# Patient Record
Sex: Male | Born: 1975 | Race: White | Hispanic: No | Marital: Married | Smoking: Current every day smoker
Health system: Southern US, Community
[De-identification: ages and names within clinical notes are randomized; demographics above are authoritative.]

## PROBLEM LIST (undated history)

## (undated) DIAGNOSIS — Z9289 Personal history of other medical treatment: Secondary | ICD-10-CM

## (undated) DIAGNOSIS — R112 Nausea with vomiting, unspecified: Secondary | ICD-10-CM

## (undated) DIAGNOSIS — F1910 Other psychoactive substance abuse, uncomplicated: Secondary | ICD-10-CM

## (undated) DIAGNOSIS — Z9889 Other specified postprocedural states: Secondary | ICD-10-CM

## (undated) DIAGNOSIS — R51 Headache: Secondary | ICD-10-CM

## (undated) DIAGNOSIS — R519 Headache, unspecified: Secondary | ICD-10-CM

## (undated) DIAGNOSIS — W3400XA Accidental discharge from unspecified firearms or gun, initial encounter: Secondary | ICD-10-CM

## (undated) DIAGNOSIS — I10 Essential (primary) hypertension: Secondary | ICD-10-CM

## (undated) HISTORY — PX: SPINE SURGERY: SHX786

## (undated) HISTORY — PX: CYST EXCISION: SHX5701

## (undated) HISTORY — PX: BACK SURGERY: SHX140

---

## 1996-05-29 DIAGNOSIS — W3400XA Accidental discharge from unspecified firearms or gun, initial encounter: Secondary | ICD-10-CM

## 1996-05-29 HISTORY — DX: Accidental discharge from unspecified firearms or gun, initial encounter: W34.00XA

## 2015-01-05 ENCOUNTER — Encounter: Payer: Self-pay | Admitting: Physician Assistant

## 2015-01-05 ENCOUNTER — Ambulatory Visit (INDEPENDENT_AMBULATORY_CARE_PROVIDER_SITE_OTHER): Payer: BLUE CROSS/BLUE SHIELD | Admitting: Physician Assistant

## 2015-01-05 ENCOUNTER — Encounter (INDEPENDENT_AMBULATORY_CARE_PROVIDER_SITE_OTHER): Payer: Self-pay

## 2015-01-05 ENCOUNTER — Ambulatory Visit (INDEPENDENT_AMBULATORY_CARE_PROVIDER_SITE_OTHER): Payer: BLUE CROSS/BLUE SHIELD

## 2015-01-05 VITALS — BP 143/96 | HR 88 | Temp 98.6°F | Ht 72.0 in | Wt 203.0 lb

## 2015-01-05 DIAGNOSIS — M5442 Lumbago with sciatica, left side: Secondary | ICD-10-CM

## 2015-01-05 MED ORDER — HYDROCODONE-ACETAMINOPHEN 5-325 MG PO TABS: 1.0000 | ORAL_TABLET | Freq: Four times a day (QID) | ORAL | Status: DC | PRN

## 2015-01-05 MED ORDER — PREDNISONE 10 MG (21) PO TBPK: ORAL_TABLET | ORAL | Status: DC

## 2015-01-05 MED ORDER — MELOXICAM 15 MG PO TABS: 15.0000 mg | ORAL_TABLET | Freq: Every day | ORAL | Status: DC

## 2015-01-05 NOTE — Progress Notes (Signed)
   Subjective:    Patient ID: Eugene Perez, male    DOB: 1975-11-27, 39 y.o.   MRN: [id number]  HPI 69 y/lo male presents for worsening left lower back pain that radiates down his left leg x 2 weeks. He has a h/o  back surgery that was due to pain on his right side in 2012 - Dr. Clinton Sawyer in [place], [place].   Pain is worse first thing in the morning when he first wakes up. Better with squatting. Has tried medications that he had from his back surgery in 2012 ( pain med and neurontin) which provided mild relief.   No known injury. 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   Lumbar decompression Jan 2013 Discectomy 2013   Review of Systems  Constitutional: Negative.   HENT: Negative.   Eyes: Negative.   Respiratory: Negative.   Cardiovascular: Negative.   Gastrointestinal: Negative.   Endocrine: Negative.   Genitourinary: Negative.   Musculoskeletal: Positive for back pain (left side, radiating down left leg ).  Skin: Negative.   Neurological:       Numbness, tingling, radiating pain down left leg        Objective:   Physical Exam  Constitutional: He is oriented to person, place, and time. He appears well-developed and well-nourished. No distress.  Cardiovascular: Normal rate.   Musculoskeletal: He exhibits tenderness. He exhibits no edema.  Decreased ROM d/t pain   Neurological: He is alert and oriented to person, place, and time.  Skin: He is not diaphoretic.  Psychiatric: He has a normal mood and affect. His behavior is normal. Judgment and thought content normal.  Nursing note and vitals reviewed.         Assessment & Plan:  1. Low back pain with left-sided sciatica, unspecified back pain laterality  - DG Lumbar Spine 2-3 Views; Future - predniSONE (STERAPRED UNI-PAK 21 TAB) 10 MG (21) TBPK tablet; 6 pills PO on day 1, 5 on day 2, 4 on day 3, 3 on day 4, 2 on day 5, 1 on day 6  Dispense: 21 tablet; Refill: 0 - meloxicam (MOBIC) 15 MG tablet; Take 1 tablet (15  mg total) by mouth daily.  Dispense: 30 tablet; Refill: 0 - HYDROcodone-acetaminophen (NORCO) 5-325 MG per tablet; Take 1-2 tablets by mouth every 6 (six) hours as needed for moderate pain.  Dispense: 120 tablet; Refill:   - Referral to ortho for addition evaluation   I have explained to patient that it may take longer for referral since he prefers not to go back to the Surgeon that performed his first surgery. He verbalizes understanding.     Tiffany A. Chauncey Reading PA-C

## 2015-01-12 ENCOUNTER — Telehealth: Payer: Self-pay | Admitting: Physician Assistant

## 2015-01-12 DIAGNOSIS — M5442 Lumbago with sciatica, left side: Secondary | ICD-10-CM

## 2015-01-12 NOTE — Telephone Encounter (Signed)
I ordered referral off of your note. Will you please review question about the steroid

## 2015-01-14 ENCOUNTER — Other Ambulatory Visit: Payer: Self-pay | Admitting: Physician Assistant

## 2015-01-14 DIAGNOSIS — M5442 Lumbago with sciatica, left side: Secondary | ICD-10-CM

## 2015-01-14 MED ORDER — PREDNISONE 10 MG (21) PO TBPK: ORAL_TABLET | ORAL | Status: DC

## 2015-01-14 NOTE — Telephone Encounter (Signed)
Yes, he is getting a referral , however, I explained to patient that it may take longer for referral since he prefers not to go back to the Surgeon that performed his first surgery in [address]. This has to be approved by his insurance. I advised him during his last appointment to call his old ortho dr for quicker appointment. We can not the type of steroid injection that he needs in office. It must be done under ultrasound in an ortho office. I can give 1 refill of the steroids but pain will most likely recur after discontinuation.   Eugene Perez A. Chauncey Reading PA-C

## 2015-01-14 NOTE — Telephone Encounter (Signed)
Pt notified of Tiffany's recommendation Verbalizes understanding 

## 2015-01-14 NOTE — Telephone Encounter (Signed)
He has to have an appointment with ortho to have a steroid injection in the spine. No family practice will do that. It may work better but he will not know until he is evaluated further by an ortho office. For the quickest appt, I suggest he follow up with his old office instead of waiting on insurance approval and appt for a new patient at a new practice.   Stana Bayon A. Chauncey Reading PA-C

## 2015-01-14 NOTE — Telephone Encounter (Signed)
Pt notified that RX was sent into [facility] He wants to know if steroid injection would work better Please advise

## 2015-01-15 NOTE — Telephone Encounter (Signed)
Pt notified that a family practitioner would not be able to spinal steroid injection that would need to be done after further eval from Ortho. Pt is wanting to wait for appt w/ Ortho in [place], would rather not return to previous Ortho.

## 2015-01-21 ENCOUNTER — Encounter: Payer: Self-pay | Admitting: Orthopedic Surgery

## 2015-03-06 ENCOUNTER — Telehealth: Payer: Self-pay | Admitting: Family Medicine

## 2015-03-06 NOTE — Telephone Encounter (Signed)
After hours call requesting refill of prednisone for back pain.  I declined. Encouraged 2 aleve BID X 3-4 days, tylenol, heat, and stretching.  No trouble walking or bowel/bladder dysfunction.   Red flags for being seen reviewed and they understand.   Murtis Sink, MD [facility] 03/06/2015, 3:43 PM

## 2015-03-16 ENCOUNTER — Telehealth: Payer: Self-pay | Admitting: Family Medicine

## 2015-03-16 ENCOUNTER — Telehealth: Payer: Self-pay

## 2015-03-16 NOTE — Telephone Encounter (Signed)
I have spoken with him on the phone previously about this.  I could not get him on the phone toinight.   We cannot refill prednisone or norco without an appointment.   We can give some mobic, 30 pills but he really needs to be seen.   I will ask nursing to follow up on his referral tomorrow.   Murtis SinkSam Maksymilian Mabey, MD [facility] 03/16/2015, 6:28 PM

## 2015-03-16 NOTE — Telephone Encounter (Signed)
Pt was upset bc we could not refill his prednisone or norco but would refill the mobic. Pt states that it was ridiculous and he would go some where else. .Marland Kitchen

## 2015-03-17 NOTE — Telephone Encounter (Signed)
This is sent to pool A, already

## 2015-03-29 ENCOUNTER — Ambulatory Visit (INDEPENDENT_AMBULATORY_CARE_PROVIDER_SITE_OTHER): Payer: BLUE CROSS/BLUE SHIELD | Admitting: Pediatrics

## 2015-03-29 ENCOUNTER — Encounter: Payer: Self-pay | Admitting: Pediatrics

## 2015-03-29 VITALS — BP 150/94 | HR 85 | Temp 98.5°F | Ht 72.0 in | Wt 212.0 lb

## 2015-03-29 DIAGNOSIS — M5442 Lumbago with sciatica, left side: Secondary | ICD-10-CM | POA: Diagnosis not present

## 2015-03-29 DIAGNOSIS — M545 Low back pain, unspecified: Secondary | ICD-10-CM | POA: Insufficient documentation

## 2015-03-29 DIAGNOSIS — I10 Essential (primary) hypertension: Secondary | ICD-10-CM | POA: Diagnosis not present

## 2015-03-29 MED ORDER — MELOXICAM 15 MG PO TABS: 15.0000 mg | ORAL_TABLET | Freq: Every day | ORAL | Status: DC

## 2015-03-29 MED ORDER — HYDROCODONE-ACETAMINOPHEN 5-325 MG PO TABS: 1.0000 | ORAL_TABLET | Freq: Four times a day (QID) | ORAL | Status: DC | PRN

## 2015-03-29 NOTE — Progress Notes (Signed)
Subjective:    Patient ID: Eugene Perez, male    DOB: 12/07/1975, 39 y.o.   MRN: [id number]  CC: low back pain  HPI: Eugene MountsKeith Jason Perez is a 39 y.o. male presenting on 03/29/2015 for Back Pain  Has had two back surgeries. Had a broken vertebrae. Two discs ruptured. Has had multiple MRIs. No pain at first after back surgeries, pain started apprx 2.5 mo ago. Prednisone took away the pain. Has had two prednisone taper packs over past 10 weeks. Took norco and meloxicam after the prednisone pack was over, helped some with the pain but not as much as the prednisone. If stands on L leg for 10 minutes not moving L leg will go numb, and he can't control it. He has not fallen. Has pain off and on, when he starts moving it is worse.  For about the past week has had leg pain again, pain shoots down back of L leg.   Relevant past medical, surgical, family and social history reviewed and updated as indicated. Interim medical history since our last visit reviewed. Allergies and medications reviewed and updated.   ROS: Per HPI unless specifically indicated above  Past Medical History Patient Active Problem List   Diagnosis Date Noted  . Low back pain 03/29/2015  . Essential hypertension 03/29/2015    Current Outpatient Prescriptions  Medication Sig Dispense Refill  . HYDROcodone-acetaminophen (NORCO) 5-325 MG tablet Take 1-2 tablets by mouth every 6 (six) hours as needed for moderate pain. 60 tablet 0  . meloxicam (MOBIC) 15 MG tablet Take 1 tablet (15 mg total) by mouth daily. 30 tablet 5   No current facility-administered medications for this visit.       Objective:    BP 150/94 mmHg  Pulse 85  Temp(Src) 98.5 F (36.9 C) (Oral)  Ht 6' (1.829 m)  Wt 212 lb (96.163 kg)  BMI 28.75 kg/m2  Wt Readings from Last 3 Encounters:  03/29/15 212 lb (96.163 kg)  01/05/15 203 lb (92.08 kg)    Gen: NAD, alert, cooperative with exam, NCAT EYES: EOMI, no scleral injection or  icterus CV: NRRR, normal S1/S2, no murmur, distal pulses 2+ b/l Resp: CTABL, no wheezes, normal WOB Abd: +BS, soft, NTND. no guarding or organomegaly Ext: No edema, warm Neuro: Alert and oriented, strength equal b/l UE and LE including hand grip, hip flex, knee flex/extpatellar reflexes 2+ b/l, decreased sensation over L leg compared to R, coordination grossly normal MSK: no point tenderness over spine or paraspinous muscles     Assessment & Plan:   Mellody DanceKeith was seen today for back pain, numbness L leg. Will refer back to orthopedic surgery, while pt does not want surgery if at all possible, would be interested in steroid injections if it is something that might help delay further surgery. For now will do Meloxicam daily and norco as needed. Discussed only taking norco as needed, not sharing pills, keeping out ofreach of children and others. Gave one prescription for #60 tabs of norco 5mg /325mg . Would need to be seen in clinic before any more refills are given, pt aware. If needing regularly will refer to pain clinic. Pt's blood pressure is still elevated today. Pt does not want to have blood drawn today, wanted to delay starting Bp meds and labs until next visit. Will follow up in 4 weeks.  Diagnoses and all orders for this visit:  Low back pain with left-sided sciatica, unspecified back pain laterality -     meloxicam (  MOBIC) 15 MG tablet; Take 1 tablet (15 mg total) by mouth daily. -     HYDROcodone-acetaminophen (NORCO) 5-325 MG tablet; Take 1-2 tablets by mouth every 6 (six) hours as needed for severe -     Ambulatory referral to Orthopedic Surgery  Essential hypertension Needs labs Has been on BP meds in the past  Follow up plan: Return in about 4 weeks (around 04/26/2015) for follow up back pain and blood pressure.  Rex Kras, MD [facility] 03/29/2015, 4:28 PM

## 2015-04-26 ENCOUNTER — Ambulatory Visit: Payer: BLUE CROSS/BLUE SHIELD | Admitting: Pediatrics

## 2015-04-27 ENCOUNTER — Encounter: Payer: Self-pay | Admitting: Pediatrics

## 2015-06-16 ENCOUNTER — Encounter: Payer: Self-pay | Admitting: Pediatrics

## 2015-06-16 ENCOUNTER — Ambulatory Visit (INDEPENDENT_AMBULATORY_CARE_PROVIDER_SITE_OTHER): Payer: BLUE CROSS/BLUE SHIELD | Admitting: Pediatrics

## 2015-06-16 VITALS — BP 161/96 | HR 74 | Temp 98.9°F | Ht 72.0 in | Wt 214.0 lb

## 2015-06-16 DIAGNOSIS — G629 Polyneuropathy, unspecified: Secondary | ICD-10-CM | POA: Diagnosis not present

## 2015-06-16 DIAGNOSIS — M5442 Lumbago with sciatica, left side: Secondary | ICD-10-CM

## 2015-06-16 DIAGNOSIS — I10 Essential (primary) hypertension: Secondary | ICD-10-CM

## 2015-06-16 MED ORDER — HYDROCODONE-ACETAMINOPHEN 10-325 MG PO TABS: 0.5000 | ORAL_TABLET | Freq: Two times a day (BID) | ORAL | Status: DC | PRN

## 2015-06-16 MED ORDER — GABAPENTIN 300 MG PO CAPS: 300.0000 mg | ORAL_CAPSULE | Freq: Three times a day (TID) | ORAL | Status: AC

## 2015-06-16 MED ORDER — AMLODIPINE BESYLATE 5 MG PO TABS: 5.0000 mg | ORAL_TABLET | Freq: Every day | ORAL | Status: AC

## 2015-06-16 NOTE — Progress Notes (Signed)
Subjective:    Patient ID: Eugene Perez, male    DOB: [birth year], 40 y.o.   MRN: [id number]  CC: Back Pain   HPI: Eugene Perez is a 40 y.o. male presenting for Back Pain  Says L leg is ten times worse No back pain, all pain in L leg, starting in L buttock, constantly shooting down leg, position changes changes where the pain is but doesn't really take it away. Feels sharp sometimes, tingling, "like an army of ants" going down his leg  H/o broken back, had two surgeries  Sleeping has been fine, not keeping him awake at night  Neurosurgeon: Eugene Perez in [place] before, wants to be referred to someone in [place]. Original surgeries for similar symptoms in R leg. Did well for three years following the last surgery, past few years has had worsening pain/tingling symptoms in L leg.  At times has weakness in L leg, has tripped and fallen before because can't feel where leg is in space.  No fevers, no incontinence   Depression screen [facility] 2/9 06/16/2015 03/29/2015  Decreased Interest 0 0  Down, Depressed, Hopeless 0 0  PHQ - 2 Score 0 0     Relevant past medical, surgical, family and social history reviewed and updated as indicated. Interim medical history since our last visit reviewed. Allergies and medications reviewed and updated.    ROS: Per HPI unless specifically indicated above  History  Smoking status  . Current Every Day Smoker -- 1.00 packs/day  Smokeless tobacco  . Never Used    Past Medical History Patient Active Problem List   Diagnosis Date Noted  . Low back pain 03/29/2015  . Essential hypertension 03/29/2015    Current Outpatient Prescriptions  Medication Sig Dispense Refill  . meloxicam (MOBIC) 15 MG tablet Take 1 tablet (15 mg total) by mouth daily. 30 tablet 5  . amLODipine (NORVASC) 5 MG tablet Take 1 tablet (5 mg total) by mouth daily. 90 tablet 3  . gabapentin (NEURONTIN) 300 MG capsule Take 1 capsule (300 mg total) by  mouth 3 (three) times daily. 90 capsule 3  . HYDROcodone-acetaminophen (NORCO) 10-325 MG tablet Take 0.5-1 tablets by mouth every 12 (twelve) hours as needed. Do not take if you don't need it. 60 tablet 0   No current facility-administered medications for this visit.       Objective:    BP 161/96 mmHg  Pulse 74  Temp(Src) 98.9 F (37.2 C) (Oral)  Ht 6' (1.829 m)  Wt 214 lb (97.07 kg)  BMI 29.02 kg/m2  Wt Readings from Last 3 Encounters:  06/16/15 214 lb (97.07 kg)  03/29/15 212 lb (96.163 kg)  01/05/15 203 lb (92.08 kg)     Gen: NAD, alert, cooperative with exam, NCAT EYES: EOMI, no scleral injection or icterus ENT:  MMM LYMPH: no cervical LAD CV: NRRR, normal S1/S2 Resp: CTABL, no wheezes, normal WOB Ext: No edema, warm Neuro: Alert and oriented, strength equal b/l UE and LE, coordination grossly normal, tingling down to bottom of foot with touch to medial and lateral posterior L calf, normal sensation R leg     Assessment & Plan:    Eugene Perez was seen today for below med problem follow up.  Diagnoses and all orders for this visit:  Low back pain with left-sided sciatica, unspecified back pain laterality Pt in constant discomfort from the sciatica and neuropathy. Hydrocodone helped some when tried last visit. Will give Rx for #60 tabs of   norco, must be seen for further refills. Discussed ideally this is not a long term medication. If continues to need, will need UDS and pt to sign controlled substance contract. -     Ambulatory referral to Neurosurgery  -     HYDROcodone-acetaminophen (NORCO) 10-325 MG tablet; Take 0.5-1 tablets by mouth every 12 (twelve) hours as needed. Do not take if you don't need it.  Essential hypertension Has had persistently elevated BPs. Pt refuses labs today. Will start amlodipine. Pt agrees to labs next visit. -     amLODipine (NORVASC) 5 MG tablet; Take 1 tablet (5 mg total) by mouth daily.  Neuropathy (HCC) Worsening symptoms, h/o  multiple surgeries on back.  -     Ambulatory referral to Neurosurgery -     gabapentin (NEURONTIN) 300 MG capsule; Take 1 capsule (300 mg total) by mouth 3 (three) times daily.     Follow up plan: Return in about 2 weeks (around 06/30/2015) for follo wup wtih Dr. Oswaldo Perez, Bp and labs.  Rex Kras, MD [facility] 06/16/2015, 1:03 PM

## 2015-07-27 ENCOUNTER — Other Ambulatory Visit: Payer: Self-pay | Admitting: Neurosurgery

## 2015-07-27 DIAGNOSIS — M4316 Spondylolisthesis, lumbar region: Secondary | ICD-10-CM

## 2015-08-03 ENCOUNTER — Ambulatory Visit
Admission: RE | Admit: 2015-08-03 | Discharge: 2015-08-03 | Disposition: A | Discharge status: Home / Self Care | Payer: BLUE CROSS/BLUE SHIELD | Source: Ambulatory Visit | Attending: Neurosurgery | Admitting: Neurosurgery

## 2015-08-03 ENCOUNTER — Other Ambulatory Visit: Payer: Self-pay | Admitting: Neurosurgery

## 2015-08-03 ENCOUNTER — Inpatient Hospital Stay
Admission: RE | Admit: 2015-08-03 | Discharge: 2015-08-03 | Disposition: A | Discharge status: Home / Self Care | Payer: Self-pay | Source: Ambulatory Visit | Attending: Neurosurgery | Admitting: Neurosurgery

## 2015-08-03 DIAGNOSIS — R52 Pain, unspecified: Secondary | ICD-10-CM

## 2015-08-03 DIAGNOSIS — M4316 Spondylolisthesis, lumbar region: Secondary | ICD-10-CM

## 2015-08-03 MED ORDER — MEPERIDINE HCL 100 MG/ML IJ SOLN
75.0000 mg | Freq: Once | INTRAMUSCULAR | Status: AC
  Administered 2015-08-03: 100 mg via INTRAMUSCULAR

## 2015-08-03 MED ORDER — IOHEXOL 180 MG/ML  SOLN
15.0000 mL | Freq: Once | INTRAMUSCULAR | Status: AC | PRN
  Administered 2015-08-03: 15 mL via INTRATHECAL

## 2015-08-03 MED ORDER — DIAZEPAM 5 MG PO TABS
10.0000 mg | ORAL_TABLET | Freq: Once | ORAL | Status: AC
  Administered 2015-08-03: 10 mg via ORAL

## 2015-08-03 MED ORDER — ONDANSETRON HCL 4 MG/2ML IJ SOLN
4.0000 mg | Freq: Once | INTRAMUSCULAR | Status: AC
  Administered 2015-08-03: 4 mg via INTRAMUSCULAR

## 2015-08-03 NOTE — Discharge Instructions (Signed)
Myelogram Discharge Instructions  1. Go home and rest quietly for the next 24 hours.  It is important to lie flat for the next 24 hours.  Get up only to go to the restroom.  You may lie in the bed or on a couch on your back, your stomach, your left side or your right side.  You may have one pillow under your head.  You may have pillows between your knees while you are on your side or under your knees while you are on your back.  2. DO NOT drive today.  Recline the seat as far back as it will go, while still wearing your seat belt, on the way home.  3. You may get up to go to the bathroom as needed.  You may sit up for 10 minutes to eat.  You may resume your normal diet and medications unless otherwise indicated.  Drink lots of extra fluids today and tomorrow.  4. The incidence of headache, nausea, or vomiting is about 5% (one in 20 patients).  If you develop a headache, lie flat and drink plenty of fluids until the headache goes away.  Caffeinated beverages may be helpful.  If you develop severe nausea and vomiting or a headache that does not go away with flat bed rest, call [phone].  5. You may resume normal activities after your 24 hours of bed rest is over; however, do not exert yourself strongly or do any heavy lifting tomorrow. If when you get up you have a headache when standing, go back to bed and force fluids for another 24 hours.  6. Call your physician for a follow-up appointment.  The results of your myelogram will be sent directly to your physician by the following day.  7. If you have any questions or if complications develop after you arrive home, please call [phone].  Discharge instructions have been explained to the patient.  The patient, or the person responsible for the patient, fully understands these instructions.       May resume Tramadol on August 04, 2015, after 8:00 am.

## 2015-08-10 ENCOUNTER — Other Ambulatory Visit: Payer: Self-pay | Admitting: Neurosurgery

## 2015-08-19 ENCOUNTER — Inpatient Hospital Stay (HOSPITAL_COMMUNITY): Admission: RE | Admit: 2015-08-19 | Payer: BLUE CROSS/BLUE SHIELD | Source: Ambulatory Visit

## 2015-08-23 ENCOUNTER — Encounter (HOSPITAL_COMMUNITY)
Admission: RE | Admit: 2015-08-23 | Discharge: 2015-08-23 | Disposition: A | Discharge status: Home / Self Care | Payer: BLUE CROSS/BLUE SHIELD | Source: Ambulatory Visit | Attending: Neurosurgery | Admitting: Neurosurgery

## 2015-08-23 ENCOUNTER — Encounter (HOSPITAL_COMMUNITY): Payer: Self-pay

## 2015-08-23 HISTORY — DX: Other psychoactive substance abuse, uncomplicated: F19.10

## 2015-08-23 HISTORY — DX: Nausea with vomiting, unspecified: R11.2

## 2015-08-23 HISTORY — DX: Headache, unspecified: R51.9

## 2015-08-23 HISTORY — DX: Personal history of other medical treatment: Z92.89

## 2015-08-23 HISTORY — DX: Accidental discharge from unspecified firearms or gun, initial encounter: W34.00XA

## 2015-08-23 HISTORY — DX: Essential (primary) hypertension: I10

## 2015-08-23 HISTORY — DX: Headache: R51

## 2015-08-23 HISTORY — DX: Other specified postprocedural states: Z98.890

## 2015-08-23 LAB — COMPREHENSIVE METABOLIC PANEL
ALBUMIN: 3.9 g/dL (ref 3.5–5.0)
ALK PHOS: 52 U/L (ref 38–126)
ALT: 22 U/L (ref 17–63)
AST: 26 U/L (ref 15–41)
Anion gap: 9 (ref 5–15)
BILIRUBIN TOTAL: 0.5 mg/dL (ref 0.3–1.2)
BUN: 11 mg/dL (ref 6–20)
CHLORIDE: 107 mmol/L (ref 101–111)
CO2: 25 mmol/L (ref 22–32)
Calcium: 9.1 mg/dL (ref 8.9–10.3)
Creatinine, Ser: 1.12 mg/dL (ref 0.61–1.24)
GFR calc Af Amer: >60 mL/min (ref >60)
GLUCOSE: 125 mg/dL — AB (ref 65–99)
POTASSIUM: 4.1 mmol/L (ref 3.5–5.1)
Sodium: 141 mmol/L (ref 135–145)
Total Protein: 6.5 g/dL (ref 6.5–8.1)

## 2015-08-23 LAB — TYPE AND SCREEN
ABO/RH(D): A POS
ANTIBODY SCREEN: NEGATIVE

## 2015-08-23 LAB — SURGICAL PCR SCREEN
MRSA, PCR: NEGATIVE
Staphylococcus aureus: POSITIVE — AB

## 2015-08-23 LAB — CBC
HCT: 44.6 % (ref 39.0–52.0)
Hemoglobin: 15.1 g/dL (ref 13.0–17.0)
MCH: 29.8 pg (ref 26.0–34.0)
MCHC: 33.9 g/dL (ref 30.0–36.0)
MCV: 88.1 fL (ref 78.0–100.0)
PLATELETS: 223 10*3/uL (ref 150–400)
RBC: 5.06 MIL/uL (ref 4.22–5.81)
RDW: 12.7 % (ref 11.5–15.5)
WBC: 9 10*3/uL (ref 4.0–10.5)

## 2015-08-23 LAB — ABO/RH: ABO/RH(D): A POS

## 2015-08-23 MED ORDER — CEFAZOLIN SODIUM-DEXTROSE 2-4 GM/100ML-% IV SOLN
2.0000 g | INTRAVENOUS | Status: AC
  Administered 2015-08-24: 2 g via INTRAVENOUS
  Filled 2015-08-23: qty 100

## 2015-08-23 NOTE — Pre-Procedure Instructions (Signed)
Eugene Perez  08/23/2015      [facility] [address] [place], [place] - [address] [place] [id number] Phone: [phone] Fax: [phone]    Your procedure is scheduled on Tuesday March 28th 2017.  [facility] at 645 A.M.  Call this number if you have problems the morning of surgery:  [phone]   Remember:  Do not eat food or drink liquids after midnight.  Take these medicines the morning of surgery with A SIP OF WATER amlodipine (norvasc), gabapentin (neurontin), oxycodone (roxicodone) if needed  STOP: ALL Vitamins, Supplements, Effient and Herbal Medications, Fish Oils, Aspirins, NSAIDs (Nonsteroidal Anti-inflammatories such as Ibuprofen, Aleve, or Advil), and Goody's/BC Powders 7 days prior to surgery, until after surgery as directed by your physician.    Do not wear jewelry, make-up or nail polish.  Do not wear lotions, powders, or perfumes.  You may wear deodorant.  Do not shave 48 hours prior to surgery.  Men may shave face and neck.  Do not bring valuables to the hospital.  [facility] is not responsible for any belongings or valuables.  Contacts, dentures or bridgework may not be worn into surgery.  Leave your suitcase in the car.  After surgery it may be brought to your room.  For patients admitted to the hospital, discharge time will be determined by your treatment team.  Patients discharged the day of surgery will not be allowed to drive home.        Preparing for [facility] at [facility]  Before surgery, you can play an important role.  Because skin is not sterile, your skin needs to be as free of germs as possible.  You can reduce the number of germs on your skin by washing with CHG (chlorahexidine gluconate) Soap before surgery.  CHG is an antiseptic cleaner with kills germs and bonds with the skin to continue killing germs even after washing.   Please do not use if you have an allergy to CHG or  antibacterial soaps.  If your skin becomes reddened/irritated stop using the CHG.  Do not shave (including legs and underarms) for at least 48 hours prior to first CHG shower.  It is okay to shave your face.  Please follow these instructions carefully:  1. Shower with CHG Soap the night before surgery and the morning of Surgery. 2. If you choose to wash your hair, wash your hair first as usual with your normal shampoo. 3. After you shampoo, rinse your hair and body thoroughly to remove the Shampoo. 4. Use CHG as you would any other liquid soap. You can apply chg directly to the skin and wash gently with scrungie or a clean washcloth. 5. Apply the CHG Soap to your body ONLY FROM THE NECK DOWN. Do not use on open wounds or open sores. Avoid contact with your eyes, ears, mouth and genitals (private parts). Wash genitals (private parts) with your normal soap. 6. Wash thoroughly, paying special attention to the area where your surgery will be performed. 7. Thoroughly rinse your body with warm water from the neck down. 8. DO NOT shower/wash with your normal soap after using and rinsing off the CHG Soap. 9. Pat yourself dry with a clean towel.  10. Wear clean pajamas.  11. Place clean sheets on your bed the night of your first shower and do not sleep with pets.  Day of Surgery  Do not apply any lotions/deodorants the  morning of surgery. Please wear clean clothes to the hospital/surgery center.   Please read over the following fact sheets that you were given. Pain Booklet, Coughing and Deep Breathing, Blood Transfusion Information, MRSA Information and Surgical Site Infection Prevention

## 2015-08-24 ENCOUNTER — Inpatient Hospital Stay (HOSPITAL_COMMUNITY): Payer: BLUE CROSS/BLUE SHIELD | Admitting: Anesthesiology

## 2015-08-24 ENCOUNTER — Inpatient Hospital Stay (HOSPITAL_COMMUNITY): Payer: BLUE CROSS/BLUE SHIELD

## 2015-08-24 ENCOUNTER — Encounter (HOSPITAL_COMMUNITY)
Admission: AD | Disposition: A | Discharge status: Home / Self Care | Payer: Self-pay | Source: Ambulatory Visit | Attending: Neurosurgery

## 2015-08-24 ENCOUNTER — Inpatient Hospital Stay (HOSPITAL_COMMUNITY): Payer: BLUE CROSS/BLUE SHIELD | Admitting: Emergency Medicine

## 2015-08-24 ENCOUNTER — Inpatient Hospital Stay (HOSPITAL_COMMUNITY)
Admission: AD | Admit: 2015-08-24 | Discharge: 2015-08-26 | DRG: 460 | Disposition: A | Discharge status: Home / Self Care | Payer: BLUE CROSS/BLUE SHIELD | Source: Ambulatory Visit | Attending: Neurosurgery | Admitting: Neurosurgery

## 2015-08-24 ENCOUNTER — Encounter (HOSPITAL_COMMUNITY): Payer: Self-pay | Admitting: *Deleted

## 2015-08-24 DIAGNOSIS — M4316 Spondylolisthesis, lumbar region: Principal | ICD-10-CM | POA: Diagnosis present

## 2015-08-24 DIAGNOSIS — Z79899 Other long term (current) drug therapy: Secondary | ICD-10-CM | POA: Diagnosis not present

## 2015-08-24 DIAGNOSIS — I10 Essential (primary) hypertension: Secondary | ICD-10-CM | POA: Diagnosis present

## 2015-08-24 DIAGNOSIS — F17210 Nicotine dependence, cigarettes, uncomplicated: Secondary | ICD-10-CM | POA: Diagnosis present

## 2015-08-24 DIAGNOSIS — M79605 Pain in left leg: Secondary | ICD-10-CM | POA: Diagnosis present

## 2015-08-24 DIAGNOSIS — M5410 Radiculopathy, site unspecified: Secondary | ICD-10-CM | POA: Diagnosis present

## 2015-08-24 DIAGNOSIS — M549 Dorsalgia, unspecified: Secondary | ICD-10-CM

## 2015-08-24 HISTORY — PX: POSTERIOR LUMBAR FUSION: SHX6036

## 2015-08-24 SURGERY — POSTERIOR LUMBAR FUSION 1 LEVEL
Anesthesia: General | Site: Back

## 2015-08-24 MED ORDER — SODIUM CHLORIDE 0.9 % IV SOLN: 250.0000 mL | INTRAVENOUS | Status: DC

## 2015-08-24 MED ORDER — SUGAMMADEX SODIUM 200 MG/2ML IV SOLN
INTRAVENOUS | Status: DC | PRN
  Administered 2015-08-24: 200 mg via INTRAVENOUS

## 2015-08-24 MED ORDER — MENTHOL 3 MG MT LOZG: 1.0000 | LOZENGE | OROMUCOSAL | Status: DC | PRN

## 2015-08-24 MED ORDER — ONDANSETRON HCL 4 MG/2ML IJ SOLN
INTRAMUSCULAR | Status: AC
  Filled 2015-08-24: qty 2

## 2015-08-24 MED ORDER — ONDANSETRON HCL 4 MG/2ML IJ SOLN: 4.0000 mg | Freq: Four times a day (QID) | INTRAMUSCULAR | Status: DC | PRN

## 2015-08-24 MED ORDER — SENNA 8.6 MG PO TABS
1.0000 | ORAL_TABLET | Freq: Two times a day (BID) | ORAL | Status: DC
  Administered 2015-08-24 – 2015-08-26 (×4): 8.6 mg via ORAL
  Filled 2015-08-24 (×4): qty 1

## 2015-08-24 MED ORDER — ROCURONIUM BROMIDE 50 MG/5ML IV SOLN
INTRAVENOUS | Status: AC
  Filled 2015-08-24: qty 1

## 2015-08-24 MED ORDER — AMLODIPINE BESYLATE 5 MG PO TABS
5.0000 mg | ORAL_TABLET | Freq: Every day | ORAL | Status: DC
  Administered 2015-08-25: 5 mg via ORAL
  Filled 2015-08-24: qty 1

## 2015-08-24 MED ORDER — ONDANSETRON HCL 4 MG/2ML IJ SOLN
4.0000 mg | Freq: Four times a day (QID) | INTRAMUSCULAR | Status: DC | PRN
  Administered 2015-08-24 – 2015-08-26 (×5): 4 mg via INTRAVENOUS
  Filled 2015-08-24 (×5): qty 2

## 2015-08-24 MED ORDER — VANCOMYCIN HCL 1000 MG IV SOLR
INTRAVENOUS | Status: AC
  Filled 2015-08-24: qty 1000

## 2015-08-24 MED ORDER — SODIUM CHLORIDE 0.9 % IV SOLN
INTRAVENOUS | Status: DC
  Administered 2015-08-24: 16:00:00 via INTRAVENOUS

## 2015-08-24 MED ORDER — NALOXONE HCL 0.4 MG/ML IJ SOLN: 0.4000 mg | INTRAMUSCULAR | Status: DC | PRN

## 2015-08-24 MED ORDER — VANCOMYCIN HCL 1000 MG IV SOLR
INTRAVENOUS | Status: DC | PRN
  Administered 2015-08-24: 1000 mg

## 2015-08-24 MED ORDER — DIPHENHYDRAMINE HCL 12.5 MG/5ML PO ELIX: 12.5000 mg | ORAL_SOLUTION | Freq: Four times a day (QID) | ORAL | Status: DC | PRN

## 2015-08-24 MED ORDER — CEFAZOLIN SODIUM 1-5 GM-% IV SOLN
1.0000 g | Freq: Three times a day (TID) | INTRAVENOUS | Status: AC
  Administered 2015-08-24 – 2015-08-25 (×2): 1 g via INTRAVENOUS
  Filled 2015-08-24 (×2): qty 50

## 2015-08-24 MED ORDER — PROPOFOL 10 MG/ML IV BOLUS
INTRAVENOUS | Status: AC
  Filled 2015-08-24: qty 20

## 2015-08-24 MED ORDER — ACETAMINOPHEN 325 MG PO TABS: 650.0000 mg | ORAL_TABLET | ORAL | Status: DC | PRN

## 2015-08-24 MED ORDER — ONDANSETRON HCL 4 MG/2ML IJ SOLN: 4.0000 mg | INTRAMUSCULAR | Status: DC | PRN

## 2015-08-24 MED ORDER — 0.9 % SODIUM CHLORIDE (POUR BTL) OPTIME
TOPICAL | Status: DC | PRN
  Administered 2015-08-24: 1000 mL

## 2015-08-24 MED ORDER — DIAZEPAM 5 MG PO TABS
ORAL_TABLET | ORAL | Status: AC
Start: 2015-08-24 — End: 2015-08-25
  Filled 2015-08-24: qty 1

## 2015-08-24 MED ORDER — HYDROMORPHONE HCL 1 MG/ML IJ SOLN
INTRAMUSCULAR | Status: DC | PRN
  Administered 2015-08-24: .2 mg via INTRAVENOUS
  Administered 2015-08-24 (×2): .4 mg via INTRAVENOUS

## 2015-08-24 MED ORDER — DEXAMETHASONE SODIUM PHOSPHATE 10 MG/ML IJ SOLN
INTRAMUSCULAR | Status: DC | PRN
  Administered 2015-08-24: 10 mg via INTRAVENOUS

## 2015-08-24 MED ORDER — BUPIVACAINE LIPOSOME 1.3 % IJ SUSP
20.0000 mL | Freq: Once | INTRAMUSCULAR | Status: DC
  Filled 2015-08-24: qty 20

## 2015-08-24 MED ORDER — SUGAMMADEX SODIUM 200 MG/2ML IV SOLN
INTRAVENOUS | Status: AC
  Filled 2015-08-24: qty 2

## 2015-08-24 MED ORDER — SODIUM CHLORIDE 0.9% FLUSH: 9.0000 mL | INTRAVENOUS | Status: DC | PRN

## 2015-08-24 MED ORDER — HYDROMORPHONE HCL 1 MG/ML IJ SOLN
INTRAMUSCULAR | Status: AC
  Filled 2015-08-24: qty 1

## 2015-08-24 MED ORDER — PHENYLEPHRINE 40 MCG/ML (10ML) SYRINGE FOR IV PUSH (FOR BLOOD PRESSURE SUPPORT)
PREFILLED_SYRINGE | INTRAVENOUS | Status: AC
  Filled 2015-08-24: qty 10

## 2015-08-24 MED ORDER — OXYCODONE-ACETAMINOPHEN 5-325 MG PO TABS
ORAL_TABLET | ORAL | Status: AC
  Filled 2015-08-24: qty 2

## 2015-08-24 MED ORDER — MIDAZOLAM HCL 5 MG/5ML IJ SOLN
INTRAMUSCULAR | Status: DC | PRN
  Administered 2015-08-24: 2 mg via INTRAVENOUS

## 2015-08-24 MED ORDER — LIDOCAINE HCL (CARDIAC) 20 MG/ML IV SOLN
INTRAVENOUS | Status: DC | PRN
  Administered 2015-08-24: 80 mg via INTRAVENOUS

## 2015-08-24 MED ORDER — OXYCODONE-ACETAMINOPHEN 5-325 MG PO TABS
1.0000 | ORAL_TABLET | ORAL | Status: DC | PRN
  Administered 2015-08-24 – 2015-08-26 (×4): 2 via ORAL
  Filled 2015-08-24 (×4): qty 2

## 2015-08-24 MED ORDER — LIDOCAINE HCL (CARDIAC) 20 MG/ML IV SOLN
INTRAVENOUS | Status: AC
  Filled 2015-08-24: qty 5

## 2015-08-24 MED ORDER — MORPHINE SULFATE 2 MG/ML IV SOLN
INTRAVENOUS | Status: DC
  Administered 2015-08-24: 22.5 mg via INTRAVENOUS
  Administered 2015-08-24: 14:00:00 via INTRAVENOUS
  Administered 2015-08-25: 13.5 mg via INTRAVENOUS
  Administered 2015-08-25: 7.5 mg via INTRAVENOUS

## 2015-08-24 MED ORDER — THROMBIN 20000 UNITS EX SOLR
CUTANEOUS | Status: DC | PRN
  Administered 2015-08-24: 20 mL via TOPICAL

## 2015-08-24 MED ORDER — DIPHENHYDRAMINE HCL 50 MG/ML IJ SOLN: 12.5000 mg | Freq: Four times a day (QID) | INTRAMUSCULAR | Status: DC | PRN

## 2015-08-24 MED ORDER — BUPIVACAINE LIPOSOME 1.3 % IJ SUSP
INTRAMUSCULAR | Status: DC | PRN
  Administered 2015-08-24: 20 mL

## 2015-08-24 MED ORDER — SODIUM CHLORIDE 0.9% FLUSH: 3.0000 mL | INTRAVENOUS | Status: DC | PRN

## 2015-08-24 MED ORDER — PHENOL 1.4 % MT LIQD: 1.0000 | OROMUCOSAL | Status: DC | PRN

## 2015-08-24 MED ORDER — ZOLPIDEM TARTRATE 5 MG PO TABS: 5.0000 mg | ORAL_TABLET | Freq: Every evening | ORAL | Status: DC | PRN

## 2015-08-24 MED ORDER — PROPOFOL 10 MG/ML IV BOLUS
INTRAVENOUS | Status: DC | PRN
  Administered 2015-08-24: 200 mg via INTRAVENOUS

## 2015-08-24 MED ORDER — SODIUM CHLORIDE 0.9% FLUSH
3.0000 mL | Freq: Two times a day (BID) | INTRAVENOUS | Status: DC
  Administered 2015-08-26: 3 mL via INTRAVENOUS

## 2015-08-24 MED ORDER — HYDROMORPHONE HCL 1 MG/ML IJ SOLN: 0.2500 mg | INTRAMUSCULAR | Status: DC | PRN

## 2015-08-24 MED ORDER — MIDAZOLAM HCL 2 MG/2ML IJ SOLN
INTRAMUSCULAR | Status: AC
  Filled 2015-08-24: qty 2

## 2015-08-24 MED ORDER — ARTIFICIAL TEARS OP OINT
TOPICAL_OINTMENT | OPHTHALMIC | Status: DC | PRN
  Administered 2015-08-24: 1 via OPHTHALMIC

## 2015-08-24 MED ORDER — GABAPENTIN 300 MG PO CAPS
300.0000 mg | ORAL_CAPSULE | Freq: Three times a day (TID) | ORAL | Status: DC
  Administered 2015-08-24 – 2015-08-26 (×6): 300 mg via ORAL
  Filled 2015-08-24 (×6): qty 1

## 2015-08-24 MED ORDER — FENTANYL CITRATE (PF) 250 MCG/5ML IJ SOLN
INTRAMUSCULAR | Status: AC
  Filled 2015-08-24: qty 5

## 2015-08-24 MED ORDER — SUCCINYLCHOLINE CHLORIDE 20 MG/ML IJ SOLN
INTRAMUSCULAR | Status: AC
  Filled 2015-08-24: qty 1

## 2015-08-24 MED ORDER — MORPHINE SULFATE 2 MG/ML IV SOLN
INTRAVENOUS | Status: AC
  Filled 2015-08-24: qty 25

## 2015-08-24 MED ORDER — MUPIROCIN 2 % EX OINT
1.0000 "application " | TOPICAL_OINTMENT | Freq: Once | CUTANEOUS | Status: AC
  Administered 2015-08-24: 1 via TOPICAL
  Filled 2015-08-24: qty 22

## 2015-08-24 MED ORDER — LACTATED RINGERS IV SOLN
INTRAVENOUS | Status: DC
  Administered 2015-08-24 (×3): via INTRAVENOUS

## 2015-08-24 MED ORDER — PHENYLEPHRINE HCL 10 MG/ML IJ SOLN
INTRAMUSCULAR | Status: DC | PRN
  Administered 2015-08-24: 40 ug via INTRAVENOUS

## 2015-08-24 MED ORDER — ROCURONIUM BROMIDE 100 MG/10ML IV SOLN
INTRAVENOUS | Status: DC | PRN
  Administered 2015-08-24: 50 mg via INTRAVENOUS
  Administered 2015-08-24: 10 mg via INTRAVENOUS
  Administered 2015-08-24: 20 mg via INTRAVENOUS
  Administered 2015-08-24: 10 mg via INTRAVENOUS
  Administered 2015-08-24: 20 mg via INTRAVENOUS

## 2015-08-24 MED ORDER — DIAZEPAM 5 MG PO TABS
5.0000 mg | ORAL_TABLET | Freq: Four times a day (QID) | ORAL | Status: DC | PRN
  Administered 2015-08-24 – 2015-08-26 (×3): 5 mg via ORAL
  Filled 2015-08-24 (×2): qty 1

## 2015-08-24 MED ORDER — ACETAMINOPHEN 650 MG RE SUPP: 650.0000 mg | RECTAL | Status: DC | PRN

## 2015-08-24 MED ORDER — FENTANYL CITRATE (PF) 100 MCG/2ML IJ SOLN
INTRAMUSCULAR | Status: DC | PRN
  Administered 2015-08-24: 100 ug via INTRAVENOUS
  Administered 2015-08-24 (×5): 50 ug via INTRAVENOUS
  Administered 2015-08-24: 150 ug via INTRAVENOUS

## 2015-08-24 MED ORDER — HYDROMORPHONE HCL 1 MG/ML IJ SOLN
0.2500 mg | INTRAMUSCULAR | Status: DC | PRN
  Administered 2015-08-24 (×4): 0.5 mg via INTRAVENOUS

## 2015-08-24 MED FILL — Sodium Chloride Irrigation Soln 0.9%: Qty: 3000 | Status: AC

## 2015-08-24 MED FILL — Heparin Sodium (Porcine) Inj 1000 Unit/ML: INTRAMUSCULAR | Qty: 30 | Status: AC

## 2015-08-24 MED FILL — Sodium Chloride IV Soln 0.9%: INTRAVENOUS | Qty: 1000 | Status: AC

## 2015-08-24 SURGICAL SUPPLY — 62 items
BENZOIN TINCTURE PRP APPL 2/3 (GAUZE/BANDAGES/DRESSINGS) ×3 IMPLANT
BLADE CLIPPER SURG (BLADE) IMPLANT
BUR ACORN 6.0 (BURR) ×2 IMPLANT
BUR ACORN 6.0MM (BURR) ×1
BUR MATCHSTICK NEURO 3.0 LAGG (BURR) ×3 IMPLANT
CANISTER SUCT 3000ML PPV (MISCELLANEOUS) ×3 IMPLANT
CAP LOCKING THREADED (Cap) ×12 IMPLANT
CLOSURE WOUND 1/2 X4 (GAUZE/BANDAGES/DRESSINGS) ×1
CONT SPEC 4OZ CLIKSEAL STRL BL (MISCELLANEOUS) ×3 IMPLANT
COVER BACK TABLE 60X90IN (DRAPES) ×3 IMPLANT
DRAPE C-ARM 42X72 X-RAY (DRAPES) ×6 IMPLANT
DRAPE LAPAROTOMY 100X72X124 (DRAPES) ×3 IMPLANT
DRAPE POUCH INSTRU U-SHP 10X18 (DRAPES) ×3 IMPLANT
DRSG OPSITE 4X5.5 SM (GAUZE/BANDAGES/DRESSINGS) ×3 IMPLANT
DRSG OPSITE POSTOP 4X6 (GAUZE/BANDAGES/DRESSINGS) ×3 IMPLANT
DRSG PAD ABDOMINAL 8X10 ST (GAUZE/BANDAGES/DRESSINGS) IMPLANT
DURAPREP 26ML APPLICATOR (WOUND CARE) ×3 IMPLANT
ELECT REM PT RETURN 9FT ADLT (ELECTROSURGICAL) ×3
ELECTRODE REM PT RTRN 9FT ADLT (ELECTROSURGICAL) ×1 IMPLANT
EVACUATOR 1/8 PVC DRAIN (DRAIN) IMPLANT
GAUZE SPONGE 4X4 12PLY STRL (GAUZE/BANDAGES/DRESSINGS) ×3 IMPLANT
GAUZE SPONGE 4X4 16PLY XRAY LF (GAUZE/BANDAGES/DRESSINGS) ×3 IMPLANT
GLOVE BIOGEL M 8.0 STRL (GLOVE) ×3 IMPLANT
GLOVE EXAM NITRILE LRG STRL (GLOVE) IMPLANT
GLOVE EXAM NITRILE MD LF STRL (GLOVE) IMPLANT
GLOVE EXAM NITRILE XL STR (GLOVE) IMPLANT
GLOVE EXAM NITRILE XS STR PU (GLOVE) IMPLANT
GOWN STRL REUS W/ TWL LRG LVL3 (GOWN DISPOSABLE) ×1 IMPLANT
GOWN STRL REUS W/ TWL XL LVL3 (GOWN DISPOSABLE) IMPLANT
GOWN STRL REUS W/TWL 2XL LVL3 (GOWN DISPOSABLE) IMPLANT
GOWN STRL REUS W/TWL LRG LVL3 (GOWN DISPOSABLE) ×2
GOWN STRL REUS W/TWL XL LVL3 (GOWN DISPOSABLE)
KIT BASIN OR (CUSTOM PROCEDURE TRAY) ×3 IMPLANT
KIT INFUSE MEDIUM (Orthopedic Implant) ×3 IMPLANT
KIT ROOM TURNOVER OR (KITS) ×3 IMPLANT
NEEDLE HYPO 18GX1.5 BLUNT FILL (NEEDLE) IMPLANT
NEEDLE HYPO 21X1.5 SAFETY (NEEDLE) IMPLANT
NEEDLE HYPO 25X1 1.5 SAFETY (NEEDLE) IMPLANT
NS IRRIG 1000ML POUR BTL (IV SOLUTION) ×3 IMPLANT
PACK LAMINECTOMY NEURO (CUSTOM PROCEDURE TRAY) ×3 IMPLANT
PAD ARMBOARD 7.5X6 YLW CONV (MISCELLANEOUS) ×9 IMPLANT
PATTIES SURGICAL .5 X1 (DISPOSABLE) ×3 IMPLANT
PATTIES SURGICAL .5 X3 (DISPOSABLE) IMPLANT
ROD CREO 50MM (Rod) ×6 IMPLANT
SCREW 6.5X45 (Screw) ×9 IMPLANT
SCREW SPINE 40X5.5XPA CREO (Screw) ×1 IMPLANT
SCREW SPINE CREO 5.5X40 (Screw) ×2 IMPLANT
SPONGE LAP 4X18 X RAY DECT (DISPOSABLE) IMPLANT
SPONGE NEURO XRAY DETECT 1X3 (DISPOSABLE) IMPLANT
SPONGE SURGIFOAM ABS GEL 100 (HEMOSTASIS) ×3 IMPLANT
STRIP CLOSURE SKIN 1/2X4 (GAUZE/BANDAGES/DRESSINGS) ×2 IMPLANT
STRIP VITOSS 25X100X4MM (Neuro Prosthesis/Implant) ×3 IMPLANT
SUT VIC AB 1 CT1 18XBRD ANBCTR (SUTURE) ×2 IMPLANT
SUT VIC AB 1 CT1 8-18 (SUTURE) ×4
SUT VIC AB 2-0 CP2 18 (SUTURE) ×3 IMPLANT
SUT VIC AB 3-0 SH 8-18 (SUTURE) ×3 IMPLANT
SYR 5ML LL (SYRINGE) IMPLANT
TAPE STRIPS DRAPE STRL (GAUZE/BANDAGES/DRESSINGS) ×3 IMPLANT
TOWEL OR 17X24 6PK STRL BLUE (TOWEL DISPOSABLE) ×3 IMPLANT
TOWEL OR 17X26 10 PK STRL BLUE (TOWEL DISPOSABLE) ×3 IMPLANT
TRAY FOLEY W/METER SILVER 14FR (SET/KITS/TRAYS/PACK) ×3 IMPLANT
WATER STERILE IRR 1000ML POUR (IV SOLUTION) ×3 IMPLANT

## 2015-08-24 NOTE — Anesthesia Preprocedure Evaluation (Addendum)
Anesthesia Evaluation  Patient identified by MRN, date of birth, ID band Patient awake    Reviewed: Allergy & Precautions, NPO status , Patient's Chart, lab work & pertinent test results  Airway Mallampati: II  TM Distance: >3 FB Neck ROM: Full    Dental no notable dental hx. (+) Partial Upper, Dental Advisory Given   Pulmonary Current Smoker,    Pulmonary exam normal breath sounds clear to auscultation       Cardiovascular hypertension, Pt. on medications Normal cardiovascular exam Rhythm:Regular Rate:Normal     Neuro/Psych negative neurological ROS  negative psych ROS   GI/Hepatic negative GI ROS, (+)     substance abuse  cocaine use,   Endo/Other  negative endocrine ROS  Renal/GU negative Renal ROS  negative genitourinary   Musculoskeletal negative musculoskeletal ROS (+)   Abdominal (+)  Abdomen: soft. Bowel sounds: normal.  Peds negative pediatric ROS (+)  Hematology negative hematology ROS (+)   Anesthesia Other Findings Last cocaine use greater than 3 weeks ago per pt.  Pt understands the risks.  Zigmund GottronH Lacole Komorowski, CRNA  Reproductive/Obstetrics negative OB ROS                          Anesthesia Physical Anesthesia Plan  ASA: III  Anesthesia Plan: General   Post-op Pain Management:    Induction: Intravenous  Airway Management Planned: Oral ETT  Additional Equipment:   Intra-op Plan:   Post-operative Plan: Extubation in OR  Informed Consent: I have reviewed the patients History and Physical, chart, labs and discussed the procedure including the risks, benefits and alternatives for the proposed anesthesia with the patient or authorized representative who has indicated his/her understanding and acceptance.   Dental advisory given  Plan Discussed with: CRNA and Surgeon  Anesthesia Plan Comments: (Patient chronic cocaine user. Told of markedly increase risk for MI or CVA  intraoperatively or postoperatively)        Anesthesia Quick Evaluation

## 2015-08-24 NOTE — Transfer of Care (Signed)
Immediate Anesthesia Transfer of Care Note  Patient: Eugene Perez  Procedure(s) Performed: Procedure(s) with comments: L4-5 Posterior lumbar interbody fusion (N/A) - L4-5 Posterior lumbar interbody fusion  Patient Location: PACU  Anesthesia Type:General  Level of Consciousness: awake, alert  and oriented  Airway & Oxygen Therapy: Patient connected to face mask oxygen  Post-op Assessment: Report given to RN  Post vital signs: stable  Last Vitals:  Filed Vitals:   08/24/15 0656 08/24/15 0657  BP: 135/91   Pulse: 75   Temp:  36.9 C  Resp: 20     Complications: No apparent anesthesia complications

## 2015-08-24 NOTE — Anesthesia Procedure Notes (Signed)
Procedure Name: Intubation Date/Time: 08/24/2015 9:37 AM Performed by: Dorie RankQUINN, Flor Houdeshell M Pre-anesthesia Checklist: Patient identified, Emergency Drugs available, Suction available, Patient being monitored and Timeout performed Patient Re-evaluated:Patient Re-evaluated prior to inductionOxygen Delivery Method: Circle system utilized Preoxygenation: Pre-oxygenation with 100% oxygen Intubation Type: IV induction Ventilation: Mask ventilation without difficulty Laryngoscope Size: Mac and 4 Grade View: Grade II Tube type: Oral Tube size: 7.5 mm Number of attempts: 1 Airway Equipment and Method: Stylet Placement Confirmation: ETT inserted through vocal cords under direct vision,  positive ETCO2 and breath sounds checked- equal and bilateral Secured at: 22 cm Tube secured with: Tape Dental Injury: Teeth and Oropharynx as per pre-operative assessment

## 2015-08-24 NOTE — Op Note (Signed)
Eugene Perez, Eugene Perez NO.:  [id number]  MEDICAL RECORD NO.:  [id number]  LOCATION:                               FACILITY:  [facility]  PHYSICIAN:  Hilda Lias, M.D.   DATE OF BIRTH:  Oct 18, 1975  DATE OF PROCEDURE: DATE OF DISCHARGE:                              OPERATIVE REPORT   PREOPERATIVE DIAGNOSES:  Grade L2, L4, L5 spondylolisthesis.  Status post two lumbar surgical procedures.  Chronic radiculopathy.  POSTOPERATIVE DIAGNOSES:  Grade L2, L4, L5 spondylolisthesis.  Status post two lumbar surgical procedures.  Chronic radiculopathy.  PROCEDURE:  L4 Gill procedure, which involved removal of the spinous process, lamina and the facet of L4.  Lysis of adhesion to decompress the thecal sac as well as the L4 and L5 nerve root.  Insertion of four pedicle screws, three of them 6.5 x 45 and one by 5.5 x 40. Posterolateral arthrodesis with autograft, BMP and Vitoss.  Cell Saver. C-arm.  SURGEON:  Hilda Lias, M.D.  ASSISTANT:  Dr. Bevely Palmer.  CLINICAL HISTORY:  Eugene Perez is a gentleman who came to my office complaining of back pain radiation to both legs, the right worse than the left one.  Clinically, he has weakened dorsiflexion.  X-ray showed grade 2 spondylolisthesis at the level of 4-5.  The patient has severe degenerative disk disease with almost normal space at that level.  We talked about surgery including the possibility of fusion with and without the use of cage according to what we found during the surgery. He knew the risks and benefits.  DESCRIPTION OF PROCEDURE:  The patient was taken to the OR, and after intubation, he was positioned in a prone manner.  The back was cleaned with Betadine and DuraPrep.  Drapes were applied.  Resection of the previous scar was made and incision was carried out from L3 down to L4-5 all the way laterally with retraction of the muscle until we were able to see and feel the transverse process of L4-L5.  X-ray showed  that the lumbar clip was at the level of L4.  Immediately what I found that the posterior arch was quite loose.  We proceeded with the Gill procedure, removal of the spinous process of L4, the lamina and the facet.  They were quite loose especially on the right side.  Immediately, we found that this gentleman has quite a large amount of scar tissue probably secondary to fat graft from the 4.  Lysis was accomplished and at the end, we were be able to decompress the thecal sac as well as the L4 and L5 nerve root.  We attempted to get into the disk space, one from the right side and then from the other left side including the thin blade of the knife was unable to get into the space.  From then on, we have good decompression, we made holes at the pedicle of L4-L5.  We introduced three pedicle screws, 6.5 x 45.  The upper right, the pedicle was quite loose.  We put a screw at the same side, but it was close to the inner part of the pedicles.  From then on, we went to be more laterally  and introduced some pedicle of 5.5 x 40.  They were connected with rods and Capps.  Cross-Link from right to left was done.  Then, with the drill, we removed the periosteum of the lateral aspect of the facet of 4-5 as well as the takeoff of the transverse process.  A mix of Vitoss, BMP and autograft was used for arthrodesis.  The area was irrigated.  Valsalva maneuver was negative. Hemovac was left in the operative site and the wound was closed with Vicryl and Steri-Strip.    ______________________________ Hilda LiasErnesto Giavonni Perez, M.D.   ______________________________ Hilda LiasErnesto Kamren Perez, M.D.    EB/MEDQ  D:  08/24/2015  T:  08/24/2015  Job:  [id number]
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
## 2015-08-24 NOTE — Anesthesia Postprocedure Evaluation (Signed)
Anesthesia Post Note  Patient: Eugene Perez  Procedure(s) Performed: Procedure(s) (LRB): L4-5 Posterior lumbar interbody fusion (N/A)  Patient location during evaluation: PACU Anesthesia Type: General Level of consciousness: awake and alert Pain management: pain level controlled Vital Signs Assessment: post-procedure vital signs reviewed and stable Respiratory status: spontaneous breathing, nonlabored ventilation, respiratory function stable and patient connected to nasal cannula oxygen Cardiovascular status: blood pressure returned to baseline and stable Postop Assessment: no signs of nausea or vomiting Anesthetic complications: no    Last Vitals:  Filed Vitals:   08/24/15 1348 08/24/15 1355  BP: 127/75   Pulse: 82 86  Temp:    Resp: 19 24    Last Pain:  Filed Vitals:   08/24/15 1356  PainSc: 10-Worst pain ever                 Delvin Hedeen S

## 2015-08-24 NOTE — Op Note (Deleted)
NAME:  Eugene Perez, Eugene Perez               ACCOUNT NO.:  [id number]  MEDICAL RECORD NO.:  [id number]  LOCATION:                               FACILITY:  [facility]  PHYSICIAN:  Trinidad Ingle, M.D.   DATE OF BIRTH:  05/02/1976  DATE OF PROCEDURE: DATE OF DISCHARGE:                              OPERATIVE REPORT   PREOPERATIVE DIAGNOSES:  Grade L2, L4, L5 spondylolisthesis.  Status post two lumbar surgical procedures.  Chronic radiculopathy.  POSTOPERATIVE DIAGNOSES:  Grade L2, L4, L5 spondylolisthesis.  Status post two lumbar surgical procedures.  Chronic radiculopathy.  PROCEDURE:  L4 Gill procedure, which involved removal of the spinous process, lamina and the facet of L4.  Lysis of adhesion to decompress the thecal sac as well as the L4 and L5 nerve root.  Insertion of four pedicle screws, three of them 6.5 x 45 and one by 5.5 x 40. Posterolateral arthrodesis with autograft, BMP and Vitoss.  Cell Saver. C-arm.  SURGEON:  Eugene Perez, M.D.  ASSISTANT:  Dr. Ditty.  CLINICAL HISTORY:  Eugene Perez is a gentleman who came to my office complaining of back pain radiation to both legs, the right worse than the left one.  Clinically, he has weakened dorsiflexion.  X-ray showed grade 2 spondylolisthesis at the level of 4-5.  The patient has severe degenerative disk disease with almost normal space at that level.  We talked about surgery including the possibility of fusion with and without the use of cage according to what we found during the surgery. He knew the risks and benefits.  DESCRIPTION OF PROCEDURE:  The patient was taken to the OR, and after intubation, he was positioned in a prone manner.  The back was cleaned with Betadine and DuraPrep.  Drapes were applied.  Resection of the previous scar was made and incision was carried out from L3 down to L4-5 all the way laterally with retraction of the muscle until we were able to see and feel the transverse process of L4-L5.  X-ray showed  that the lumbar clip was at the level of L4.  Immediately what I found that the posterior arch was quite loose.  We proceeded with the Gill procedure, removal of the spinous process of L4, the lamina and the facet.  They were quite loose especially on the right side.  Immediately, we found that this gentleman has quite a large amount of scar tissue probably secondary to fat graft from the 4.  Lysis was accomplished and at the end, we were be able to decompress the thecal sac as well as the L4 and L5 nerve root.  We attempted to get into the disk space, one from the right side and then from the other left side including the thin blade of the knife was unable to get into the space.  From then on, we have good decompression, we made holes at the pedicle of L4-L5.  We introduced three pedicle screws, 6.5 x 45.  The upper right, the pedicle was quite loose.  We put a screw at the same side, but it was close to the inner part of the pedicles.  From then on, we went to be more laterally   and introduced some pedicle of 5.5 x 40.  They were connected with rods and Capps.  Cross-Link from right to left was done.  Then, with the drill, we removed the periosteum of the lateral aspect of the facet of 4-5 as well as the takeoff of the transverse process.  A mix of Vitoss, BMP and autograft was used for arthrodesis.  The area was irrigated.  Valsalva maneuver was negative. Hemovac was left in the operative site and the wound was closed with Vicryl and Steri-Strip.    ______________________________ Eugene Perez, M.D.   ______________________________ Eugene Perez, M.D.    EB/MEDQ  D:  08/24/2015  T:  08/24/2015  Job:  [id number] 

## 2015-08-25 NOTE — Progress Notes (Signed)
Patient ID: Eugene Perez, male   DOB: 12/08/1975, 40 y.o.   MRN: [id number] Doing wel , no weakness. Drain working . Seen by pt

## 2015-08-25 NOTE — Care Management Note (Signed)
Case Management Note  Patient Details  Name: Eugene Perez MRN: [id number] Date of Birth: 04/21/1976  Subjective/Objective:                    Action/Plan: Patient was admitted for a PLIF. Lives at home with spouse. Will follow for discharge needs pending PT/OT evals and physician orders.  Expected Discharge Date:                  Expected Discharge Plan:     In-House Referral:     Discharge planning Services     Post Acute Care Choice:    Choice offered to:     DME Arranged:    DME Agency:     HH Arranged:    HH Agency:     Status of Service:  In process, will continue to follow  Medicare Important Message Given:    Date Medicare IM Given:    Medicare IM give by:    Date Additional Medicare IM Given:    Additional Medicare Important Message give by:     If discussed at Long Length of Stay Meetings, dates discussed:    Additional CommentsAnda Kraft:  [name], RN 08/25/2015, 9:09 AM [phone]

## 2015-08-25 NOTE — Progress Notes (Signed)
Pt offered to get OOB and ambulate or to sit in chair, Pt stated maybe later, just wanted to sleep a little longer. Also wants to try to D/C PCA when current dose runs out.

## 2015-08-25 NOTE — Evaluation (Signed)
Physical Therapy Evaluation Patient Details Name: Eugene Perez MRN: [id number] DOB: 10/09/1975 Today's Date: 08/25/2015   History of Present Illness  Pt is a 40 y.o. male s/p L4-5 Posterior lumbar interbody fusion. PMHx: HTN, Substance abuse (cocaine), Lumbar sx x2 in 2013.    Clinical Impression  Patient evaluated by Physical Therapy with no further acute PT needs identified. All education has been completed and the patient has no further questions. Pt mod I with mobility out of bed, supervision for bed mobility secondary to precautions. Education given on safety, precautions, posture, and appropriate activity level upon return home, as pt seems somewhat impulsive.  See below for any follow-up Physical Therapy or equipment needs. PT is signing off. Thank you for this referral.     Follow Up Recommendations No PT follow up    Equipment Recommendations  None recommended by PT    Recommendations for Other Services       Precautions / Restrictions Precautions Precautions: Back;Fall Precaution Booklet Issued: No Precaution Comments: handout given by OT. Reviewed precautions and pt verbalizes but then breaks with bed mobility until stopped by therapist and cued to start over. Also reinforced with wife  Required Braces or Orthoses:  (No brace needed order) Restrictions Weight Bearing Restrictions: No      Mobility  Bed Mobility Overal bed mobility: Needs Assistance Bed Mobility: Rolling;Sidelying to Sit Rolling: Supervision Sidelying to sit: Supervision       General bed mobility comments: pt completely capable of performing bed mobility independently but needed cues to keep precautions  Transfers Overall transfer level: Modified independent Equipment used: None Transfers: Sit to/from Stand Sit to Stand: Modified independent (Device/Increase time)         General transfer comment: pt stood safely, had just been educated by OT prior to  PT  Ambulation/Gait Ambulation/Gait assistance: Modified independent (Device/Increase time) Ambulation Distance (Feet): 300 Feet Assistive device: None Gait Pattern/deviations: Antalgic Gait velocity: WFL Gait velocity interpretation: at or above normal speed for age/gender General Gait Details: pt began ambulation with no pain but as distance increased he began to have right buttocks pain that was not present before surgery  Stairs Stairs: Yes Stairs assistance: Modified independent (Device/Increase time) Stair Management: One rail Right;Forwards;Alternating pattern Number of Stairs: 10    Wheelchair Mobility    Modified Rankin (Stroke Patients Only)       Balance Overall balance assessment: No apparent balance deficits (not formally assessed)                                           Pertinent Vitals/Pain Pain Assessment: Faces Pain Score: 1  Faces Pain Scale: Hurts even more Pain Location: right buttock Pain Descriptors / Indicators: Tightness Pain Intervention(s): Monitored during session;Premedicated before session    Home Living Family/patient expects to be discharged to:: Private residence Living Arrangements: Spouse/significant other;Children Available Help at Discharge: Family;Available PRN/intermittently Type of Home: House Home Access: Stairs to enter   Entrance Stairs-Number of Steps: 3 Home Layout: One level Home Equipment: Shower seat - built in;Grab bars - tub/shower Additional Comments: pt works full time in [employer] business, he reports that at this point, 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 Educated on taking adequate time for back to heal. He also asked if he could play golf next week. precautions reinforced  Prior Function Level of Independence: Independent               Hand Dominance        Extremity/Trunk Assessment   Upper Extremity Assessment: Defer to OT evaluation            Lower Extremity Assessment: Overall WFL for tasks assessed      Cervical / Trunk Assessment: Normal  Communication   Communication: No difficulties  Cognition Arousal/Alertness: Awake/alert Behavior During Therapy: Impulsive Overall Cognitive Status: Within Functional Limits for tasks assessed                      General Comments General comments (skin integrity, edema, etc.): reinforced proper posture and healthy life habits including smoking cessation    Exercises        Assessment/Plan    PT Assessment Patent does not need any further PT services  PT Diagnosis Acute pain   PT Problem List    PT Treatment Interventions     PT Goals (Current goals can be found in the Care Plan section) Acute Rehab PT Goals Patient Stated Goal: "to get out of here" PT Goal Formulation: All assessment and education complete, DC therapy    Frequency     Barriers to discharge        Perez-evaluation               End of Session Equipment Utilized During Treatment: Gait belt Activity Tolerance: Patient tolerated treatment well Patient left: in bed;with call bell/phone within reach;with family/visitor present Nurse Communication: Mobility status         Time: 1122-1140 PT Time Calculation (min) (ACUTE ONLY): 18 min   Charges:   PT Evaluation $PT Eval Low Complexity: 1 Procedure     PT G Codes:      Eugene Perez, PT  Acute Rehab Services  [phone]   [place], [place] 08/25/2015, 1:24 PM

## 2015-08-25 NOTE — Evaluation (Signed)
Occupational Therapy Evaluation Patient Details Name: Eugene Perez MRN: [id number] DOB: May 01, 1976 Today's Date: 08/25/2015    History of Present Illness Pt is a 40 y.o. male s/p L4-5 Posterior lumbar interbody fusion. PMHx: HTN, Substance abuse (cocaine), Lumbar sx x2 in 2013.     Clinical Impression   Pt reports he was independent with ADLs and mobility PTA. Currently pt overall supervision for safety with ADLs and functional mobility secondary to impulsivity and decreased awareness of back precautions; otherwise pt would be at mod I. All back, safety, and ADL education completed with pt and wife. Pt planning to d/c home with intermittent supervision from family. No further acute OT needs identified; signing off at this time. Please re-consult if needs change. Thank you for this referral.     Follow Up Recommendations  No OT follow up;Supervision - Intermittent    Equipment Recommendations  None recommended by OT    Recommendations for Other Services PT consult     Precautions / Restrictions Precautions Precautions: Back;Fall Precaution Booklet Issued: Yes (comment) Precaution Comments: Educated pt and wife on precautions. Required Braces or Orthoses:  (No brace needed order) Restrictions Weight Bearing Restrictions: No      Mobility Bed Mobility Overal bed mobility: Needs Assistance Bed Mobility: Rolling;Sidelying to Sit Rolling: Supervision         General bed mobility comments: Pt with poor technique despite verbal cues for log roll to maintain back precautions. Supervision for safety.  Transfers Overall transfer level: Needs assistance Equipment used: None Transfers: Sit to/from Stand Sit to Stand: Supervision         General transfer comment: Supervision for safety secondary to impulsivity and poor maintaining of back precautions during functional activities.    Balance Overall balance assessment: No apparent balance deficits (not formally assessed)                                           ADL Overall ADL's : Needs assistance/impaired Eating/Feeding: Set up;Sitting   Grooming: Supervision/safety;Wash/dry hands;Standing Grooming Details (indicate cue type and reason): Educated on use of 2 cups for oral care. Upper Body Bathing: Supervision/ safety;Standing   Lower Body Bathing: Supervison/ safety;Sit to/from stand   Upper Body Dressing : Supervision/safety;Standing Upper Body Dressing Details (indicate cue type and reason): Pt able to doff/don hospital gown in standing with supervision for safety. Lower Body Dressing: Supervision/safety;Sit to/from stand Lower Body Dressing Details (indicate cue type and reason): Pt able to demo correct technique for LB ADLs; reviewed technique with pt. Toilet Transfer: Supervision/safety;Ambulation;Regular Toilet;Grab bars Toilet Transfer Details (indicate cue type and reason): Only supervision secondary to impulsivity and decreased demo of precautions. Toileting- Clothing Manipulation and Hygiene: Supervision/safety;Sit to/from stand   Tub/ Shower Transfer: Supervision/safety;Ambulation;Grab bars   Functional mobility during ADLs: Supervision/safety General ADL Comments: Educated pt and wife on back precautions during functional activities. Pt impulsive with transfers, poor bed mobility technique despite VCs; therefore pt supervision overall.     Vision     Perception     Praxis      Pertinent Vitals/Pain Pain Assessment: 0-10 Pain Score: 1  Pain Location: Incision Pain Descriptors / Indicators: Sore Pain Intervention(s): Monitored during session;Repositioned     Hand Dominance     Extremity/Trunk Assessment Upper Extremity Assessment Upper Extremity Assessment: Overall WFL for tasks assessed   Lower Extremity Assessment Lower Extremity Assessment: Defer to PT evaluation  Cervical / Trunk Assessment Cervical / Trunk Assessment: Normal   Communication  Communication Communication: No difficulties   Cognition Arousal/Alertness: Awake/alert Behavior During Therapy: Impulsive Overall Cognitive Status: Within Functional Limits for tasks assessed                     General Comments       Exercises       Shoulder Instructions      Home Living Family/patient expects to be discharged to:: Private residence Living Arrangements: Spouse/significant other;Children Available Help at Discharge: Family;Available PRN/intermittently Type of Home: House Home Access: Stairs to enter Entrance Stairs-Number of Steps: 3   Home Layout: One level     Bathroom Shower/Tub: Tub/shower unit;Walk-in shower (Pt uses walk in)   [employer] Industries[employer]athroom Toilet: Standard     Home Equipment: Shower seat - built in;Grab bars - tub/shower          Prior Functioning/Environment Level of Independence: Independent             OT Diagnosis: Acute pain   OT Problem List:     OT Treatment/Interventions:      OT Goals(Current goals can be found in the care plan section) Acute Rehab OT Goals Patient Stated Goal: "to get out of here" OT Goal Formulation: All assessment and education complete, DC therapy  OT Frequency:     Barriers to D/C:            Co-evaluation              End of Session    Activity Tolerance: Patient tolerated treatment well Patient left: in bed;with call bell/phone within reach;with family/visitor present (sitting EOB)   Time: 9528-41321109-1120 OT Time Calculation (min): 11 min Charges:  OT General Charges $OT Visit: 1 Procedure OT Evaluation $OT Eval Low Complexity: 1 Procedure G-Codes:     Gaye AlkenBailey A Kushi Kun M.S., OTR/L Pager: [phone]  08/25/2015, 11:29 AM

## 2015-08-25 NOTE — Progress Notes (Signed)
OT Cancellation Note  Patient Details Name: Eugene Perez MRN: [id number] DOB: 07/02/1975   Cancelled Treatment:    Reason Eval/Treat Not Completed: Other (comment) (Just got breakfast; pt would like OT to check back later). Will follow up for OT eval as time allows.  Gaye AlkenBailey A Nikki Glanzer  M.S., OTR/L Pager: [phone]   08/25/2015, 7:48 AM

## 2015-08-26 NOTE — Discharge Summary (Signed)
Physician Discharge Summary  Patient ID: Eugene MinisterKeith J Coral MRN: [id number] DOB/AGE: 40/12/1975 40 y.o.  Admit date: 08/24/2015 Discharge date: 08/26/2015  Admission Diagnoses:l45 spondylolisthesis  Discharge Diagnoses:  Active Problems:   Spondylolisthesis of lumbar region   Discharged Condition: no pain  Hospital Course: surgery  Consults: none  Significant Diagnostic Studies:mri  Treatments:l4-5 fusion  Discharge Exam: Blood pressure 118/64, pulse 83, temperature 97.9 F (36.6 C), temperature source Oral, resp. rate 18, height 6' (1.829 m), weight 92.987 kg (205 lb), SpO2 99 %. No weakness  Disposition: home     Medication List    ASK your doctor about these medications        amLODipine 5 MG tablet  Commonly known as:  NORVASC  Take 1 tablet (5 mg total) by mouth daily.     gabapentin 300 MG capsule  Commonly known as:  NEURONTIN  Take 1 capsule (300 mg total) by mouth 3 (three) times daily.     HYDROcodone-acetaminophen 10-325 MG tablet  Commonly known as:  NORCO  Take 0.5-1 tablets by mouth every 12 (twelve) hours as needed. Do not take if you don't need it.     meloxicam 15 MG tablet  Commonly known as:  MOBIC  Take 1 tablet (15 mg total) by mouth daily.     naproxen sodium 220 MG tablet  Commonly known as:  ANAPROX  Take 440 mg by mouth 2 (two) times daily as needed (for pain).     oxyCODONE 15 MG immediate release tablet  Commonly known as:  ROXICODONE  Take 15 mg by mouth every 6 (six) hours as needed for pain.         Signed: Karn CassisBOTERO,Ashia Dehner M 08/26/2015, 8:18 AM

## 2015-08-26 NOTE — Progress Notes (Signed)
Discharge instructions reviewed with patient. Per MD, pt will stop by his office for RX and other needs. Family is outside at this time waiting for patient to transport to home.   Taia Bramlett,RN

## 2015-08-26 NOTE — Progress Notes (Signed)
Per previous staffs, PCA has been d/c'ed x 2 days ago. MD aware.  Sim BoastHavy, RN

## 2015-08-26 NOTE — Care Management Note (Signed)
Case Management Note  Patient Details  Name: Eugene Perez MRN: [id number] Date of Birth: 08/26/1975  Subjective/Objective:                    Action/Plan: Patient discharging home with self care. No f/u per PT/OT. No further needs per CM.   Expected Discharge Date:                  Expected Discharge Plan:  Home/Self Care  In-House Referral:     Discharge planning Services     Post Acute Care Choice:    Choice offered to:     DME Arranged:    DME Agency:     HH Arranged:    HH Agency:     Status of Service:  Completed, signed off  Medicare Important Message Given:    Date Medicare IM Given:    Medicare IM give by:    Date Additional Medicare IM Given:    Additional Medicare Important Message give by:     If discussed at Long Length of Stay Meetings, dates discussed:    Additional Comments:  Kermit BaloKelli F Shaunita Seney, RN 08/26/2015, 9:25 AM

## 2015-08-27 ENCOUNTER — Emergency Department (HOSPITAL_COMMUNITY)
Admission: EM | Admit: 2015-08-27 | Discharge: 2015-08-27 | Discharge status: Against Medical Advice | Payer: BLUE CROSS/BLUE SHIELD | Attending: Physician Assistant | Admitting: Physician Assistant

## 2015-08-27 ENCOUNTER — Encounter (HOSPITAL_COMMUNITY): Payer: Self-pay | Admitting: Family Medicine

## 2015-08-27 DIAGNOSIS — F17210 Nicotine dependence, cigarettes, uncomplicated: Secondary | ICD-10-CM | POA: Diagnosis not present

## 2015-08-27 DIAGNOSIS — Z7952 Long term (current) use of systemic steroids: Secondary | ICD-10-CM | POA: Diagnosis not present

## 2015-08-27 DIAGNOSIS — M545 Low back pain: Secondary | ICD-10-CM | POA: Insufficient documentation

## 2015-08-27 DIAGNOSIS — R112 Nausea with vomiting, unspecified: Secondary | ICD-10-CM | POA: Diagnosis not present

## 2015-08-27 DIAGNOSIS — Z87828 Personal history of other (healed) physical injury and trauma: Secondary | ICD-10-CM | POA: Diagnosis not present

## 2015-08-27 DIAGNOSIS — I10 Essential (primary) hypertension: Secondary | ICD-10-CM | POA: Insufficient documentation

## 2015-08-27 DIAGNOSIS — Z79899 Other long term (current) drug therapy: Secondary | ICD-10-CM | POA: Insufficient documentation

## 2015-08-27 MED ORDER — PREDNISONE 20 MG PO TABS: 60.0000 mg | ORAL_TABLET | Freq: Every day | ORAL | Status: AC

## 2015-08-27 MED ORDER — OXYCODONE-ACETAMINOPHEN 5-325 MG PO TABS
1.0000 | ORAL_TABLET | ORAL | Status: DC | PRN
  Administered 2015-08-27: 1 via ORAL
  Filled 2015-08-27: qty 1

## 2015-08-27 MED ORDER — ONDANSETRON 4 MG PO TBDP
4.0000 mg | ORAL_TABLET | Freq: Once | ORAL | Status: AC | PRN
  Administered 2015-08-27: 4 mg via ORAL
  Filled 2015-08-27: qty 1

## 2015-08-27 MED ORDER — PREDNISONE 20 MG PO TABS: 60.0000 mg | ORAL_TABLET | Freq: Once | ORAL | Status: DC

## 2015-08-27 NOTE — ED Notes (Signed)
Patient states he had back surgery Tuesday (L4-L5 fused together) and was discharged from the hospital yesterday. Pt reports falling last night d/t the pain and has been experiencing a new, different kind of pain since. Pt ambulatory - pain with movement.

## 2015-08-27 NOTE — Discharge Instructions (Signed)

## 2015-08-27 NOTE — ED Notes (Signed)
Patient refused x-ray ordered by MD. MD made aware and spoke with patient, who agrees to leave AMA after MD explained to patient the risks of leaving against medical advice.
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
## 2015-08-27 NOTE — ED Notes (Signed)
Patient refused to allow RN to get discharge vitals. Patient also refused to sign AMA papers. RN advised patient to refrain from using foul language. RN obtained patient a wheelchair and patient refused to let RN wheel out. Patient had to be advised he can not hit staff.

## 2015-08-27 NOTE — ED Notes (Signed)
Pt was released yesterday from the hospital post lumbar fusion. sts he went home, became very sick and now has shooting pains down both legs. Denies incontinence.

## 2016-07-30 IMAGING — CT CT L SPINE W/ CM
1 of 6 series · 5 of 14 positions shown, 7 images · non-contrast
Comparison: 11/11/2012 lumbar spine MRI from [REDACTED]

CLINICAL DATA: Lumbar spondylolisthesis. Left lower extremity pain
and numbness. Prior lumbar surgery.
TECHNIQUE: Contiguous axial images were obtained through the Lumbar spine after
the intrathecal infusion of contrast. Coronal and sagittal
reconstructions were obtained of the axial image sets.

[Series 2: l spine soft · axial · 0.27mm/px · z∈[-258,-108]mm · 5 of 76 slices shown, 7 images]
[im 13/76  soft-tissue]
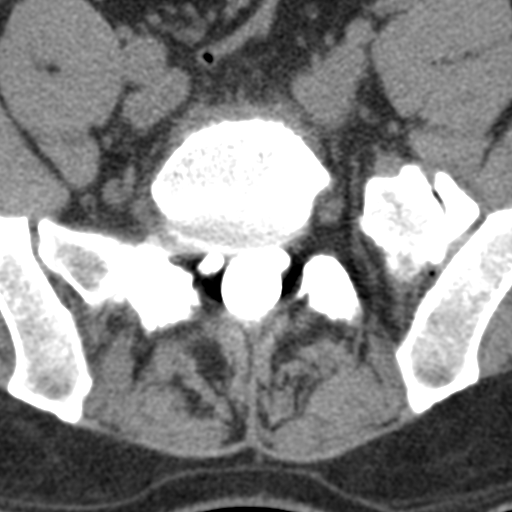
[im 13/76  bone]
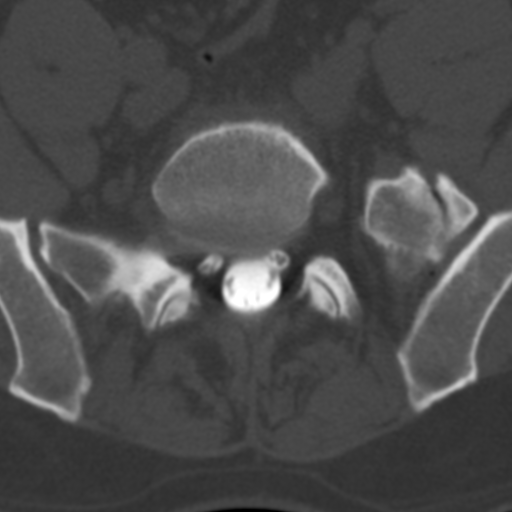
[im 26/76  bone]
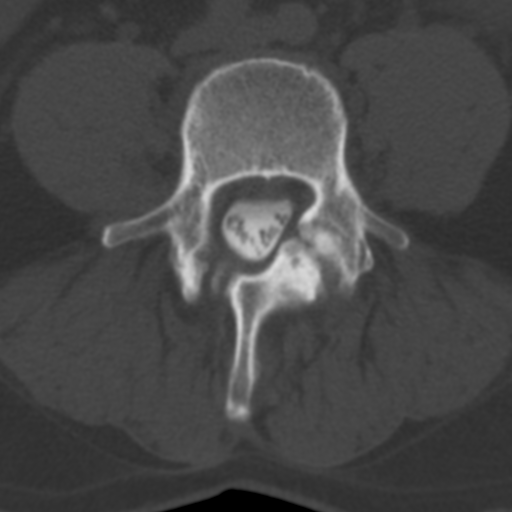
[im 38/76  bone]
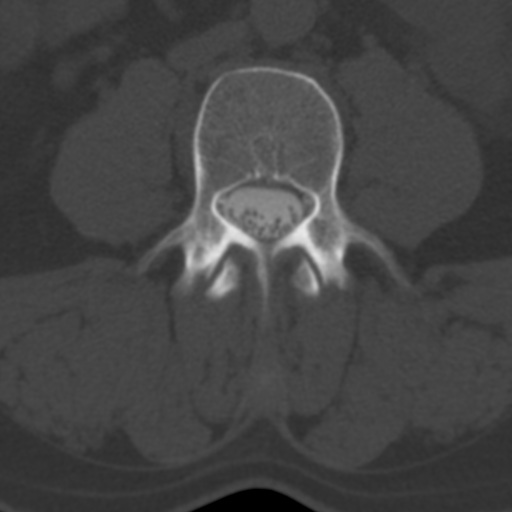
[im 51/76  bone]
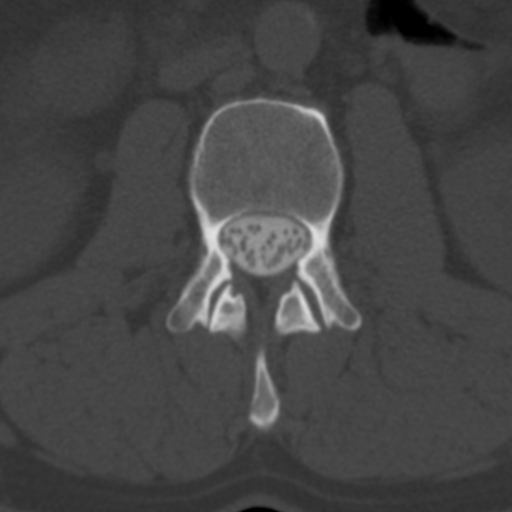
[im 63/76  soft-tissue]
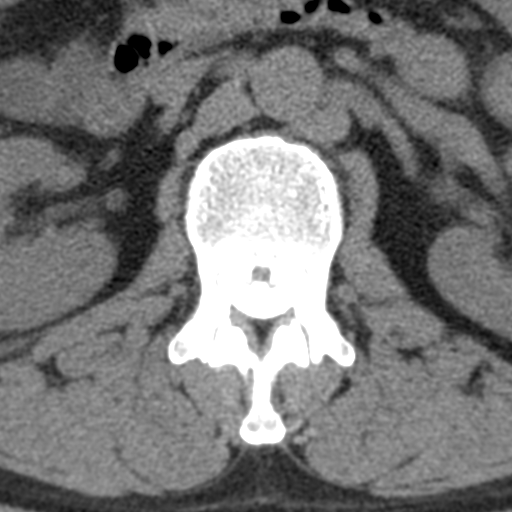
[im 63/76  bone]
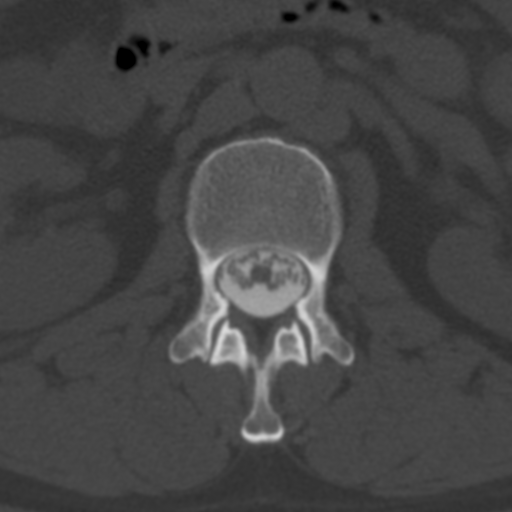

[5 of 14 positions shown; findings below may reference images not displayed]

EXAM:
LUMBAR MYELOGRAM

FLUOROSCOPY TIME:  Radiation Exposure Index (as provided by the
fluoroscopic device): 436.58 microGray*m^2

Fluoroscopy Time (in minutes and seconds):  0 minutes 37 seconds

PROCEDURE:
After thorough discussion of risks and benefits of the procedure
including bleeding, infection, injury to nerves, blood vessels,
adjacent structures as well as headache and CSF leak, written and
oral informed consent was obtained. Consent was obtained by Dr.
Koziolek Shattock. Time out form was completed.

Patient was positioned prone on the fluoroscopy table. Local
anesthesia was provided with 1% lidocaine without epinephrine after
prepped and draped in the usual sterile fashion. Puncture was
performed at L2-3 using a 3 1/2 inch 22-gauge spinal needle via a
right interlaminar approach. Using a single pass through the dura,
the needle was placed within the thecal sac, with return of clear
CSF. 15 mL of Isovue-M 200 was injected into the thecal sac, with
normal opacification of the nerve roots and cauda equina consistent
with free flow within the subarachnoid space.

I personally performed the lumbar puncture and administered the
intrathecal contrast. I also personally supervised acquisition of
the myelogram images.
FINDINGS: LUMBAR MYELOGRAM FINDINGS:

There is transitional lumbosacral anatomy. For this dictation, the
transitional segment will be considered a partially sacralized L5
with left-sided assimilation joint with the sacrum.

Grade 2 anterolisthesis of L4 on L5 measures approximately 13 mm and
does not significantly change with flexion or extension. There is
complete disc space height loss at L4-5. The ventral thecal sac is
mildly deformed at this level due to the listhesis but is without
evidence of significant spinal stenosis. There is asymmetric
deformity of the left lateral recess at this level with attenuation
of the left L5 nerve root sleeve. The thecal sac appears widely
patent elsewhere.

CT LUMBAR MYELOGRAM FINDINGS:

Grade 2 anterolisthesis of L4 on L5 measures 11 mm on this supine
CT. There is complete disc space height loss at L4-5 with
degenerative endplate sclerosis and vacuum disc phenomenon. Disc
space heights are preserved elsewhere. Vertebral body heights are
preserved. The conus medullaris terminates at L1. Mild aortoiliac
atherosclerotic calcification is noted.

L1-2 and L2-3:  Negative.

L3-4: Mild-to-moderate bilateral facet hypertrophy without disc
herniation or stenosis, unchanged.

L4-5: Sequelae of prior right laminectomy and facetectomy are again
identified. There is a left L4 pars defect. There is slight
effacement of the left lateral recess due to epidural fat, unchanged
and without left L5 nerve root impingement identified. The spinal
canal is widely patent. Listhesis, uncovered disc, and severe disc
space height loss result in moderate bilateral neural foraminal
stenosis.

L5-S1:  Negative.
IMPRESSION: 1. Grade 2 anterolisthesis of L4 on L5. Widely patent spinal canal
after posterior decompression. Moderate bilateral neural foraminal
stenosis.
2. Mild-to-moderate facet arthrosis at L3-4 without stenosis.

## 2016-08-20 IMAGING — RF DG LUMBAR SPINE COMPLETE 4+V
1 series · 4 of 4 positions shown · non-contrast
Comparison: None.

CLINICAL DATA: L4-5 PLIF

EXAM:
LUMBAR SPINE - COMPLETE 4+ VIEW; DG C-ARM 61-120 MIN fluoroscopic
time 35 seconds

[Series 1: run · 4 of 4 slices shown]
[im 1/4]
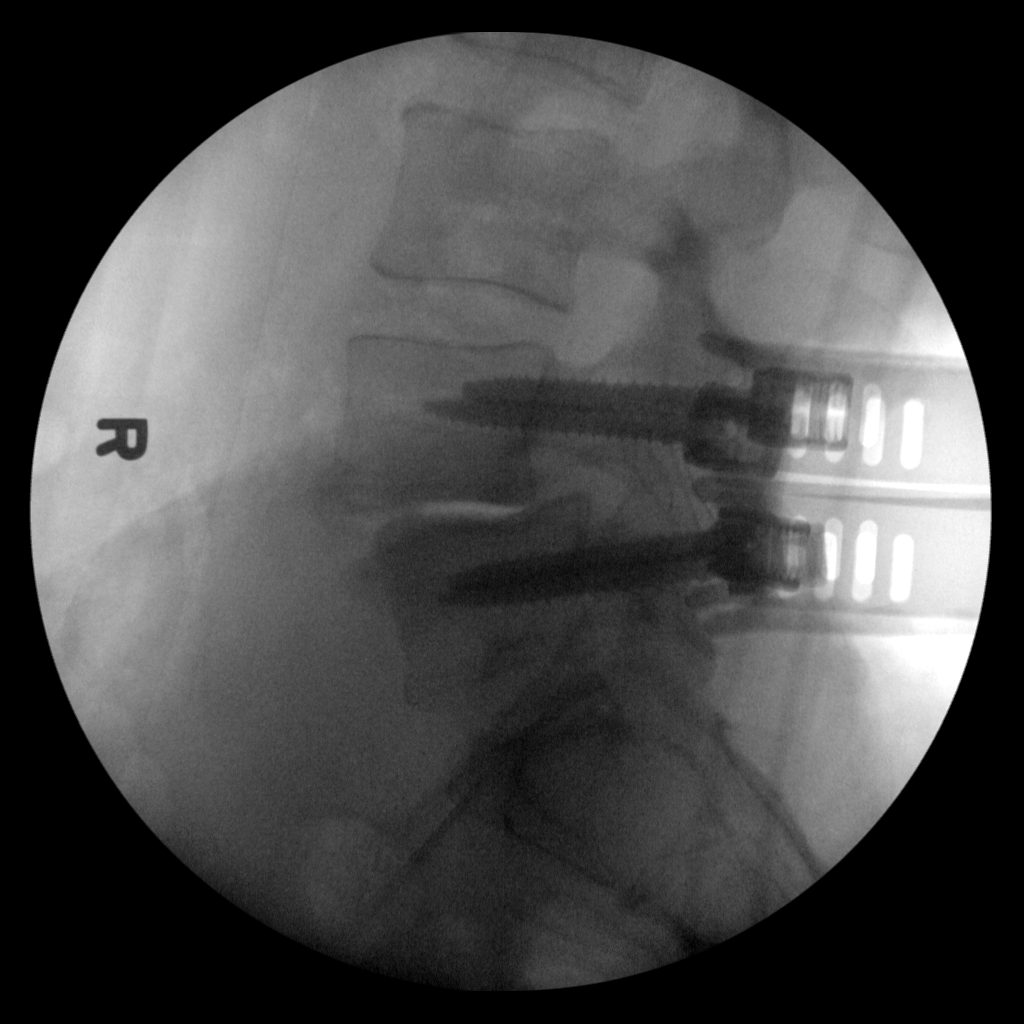
[im 2/4]
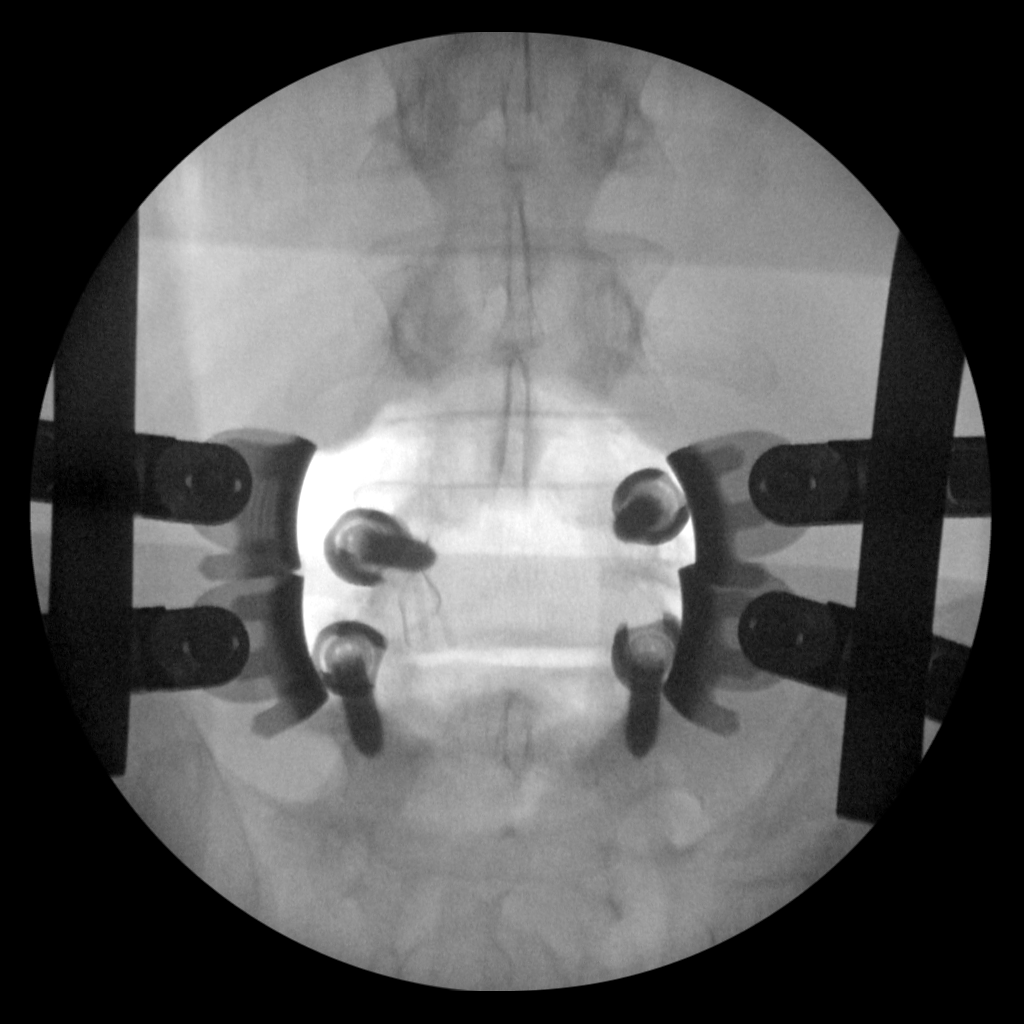
[im 3/4]
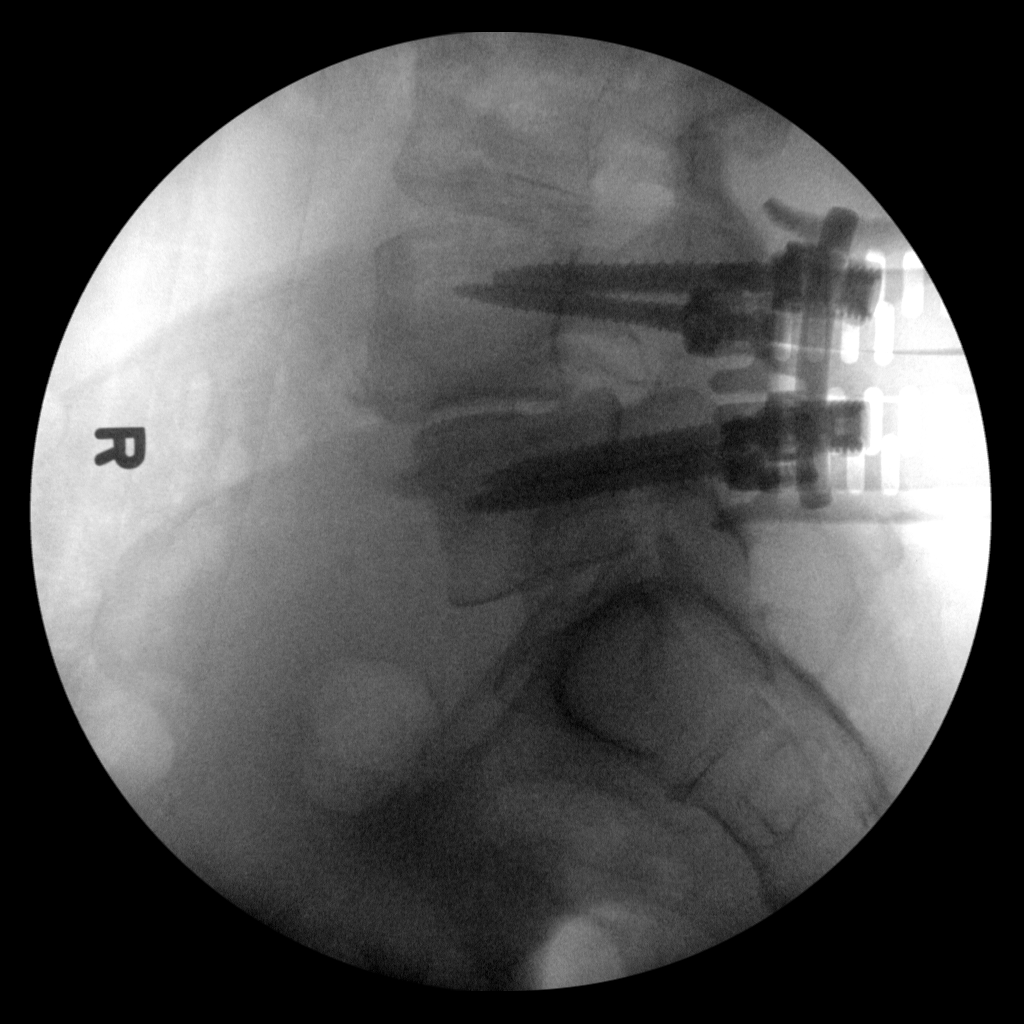
[im 4/4]
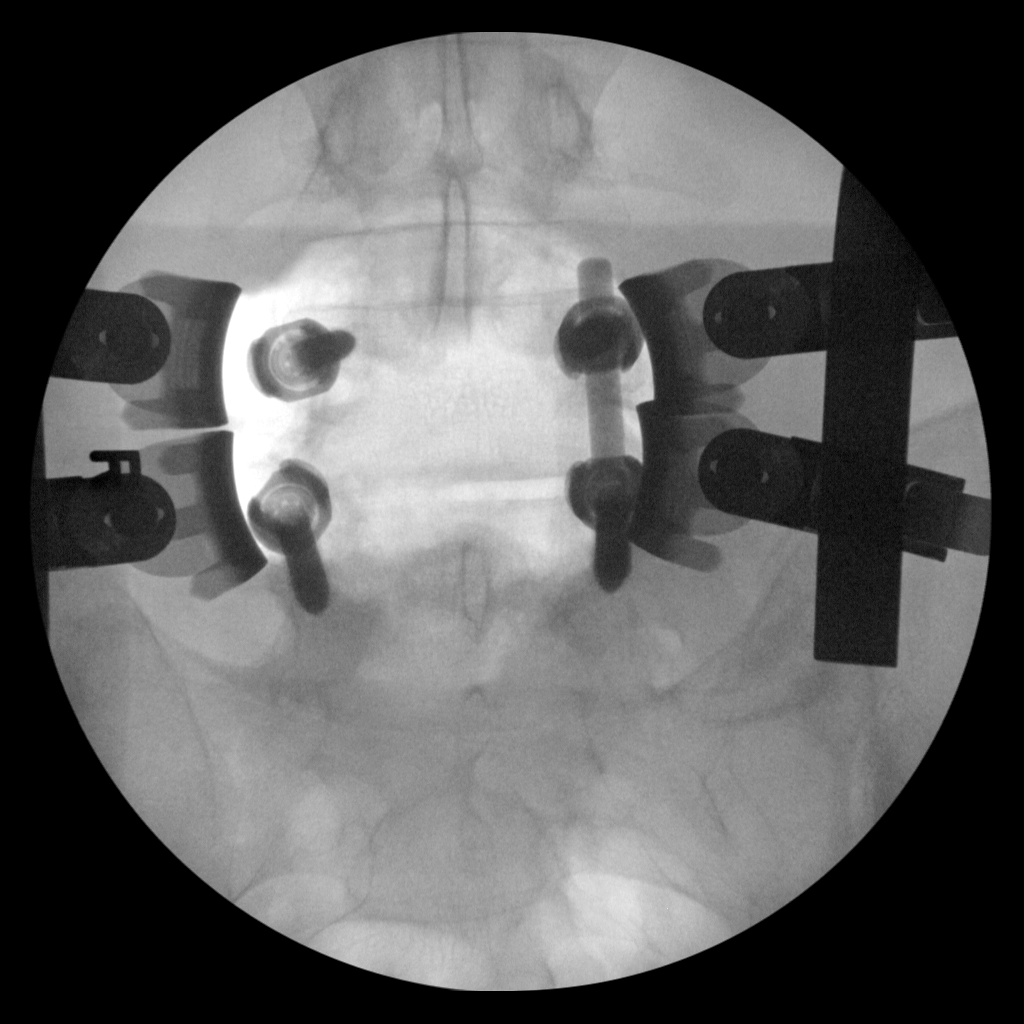

[4 of 4 positions shown; findings below may reference images not displayed]

FINDINGS: Patient status post posterior fusion of L4-5. There is grade 1
anterior listhesis of L4 on 5.
IMPRESSION: Status post posterior fusion of L4-5. Grade 1 anterior listhesis of
L4 on 5.
